# Patient Record
Sex: Female | Born: 1985 | Race: White | Hispanic: No | Marital: Married | Smoking: Never smoker
Health system: Southern US, Community
[De-identification: ages and names within clinical notes are randomized; demographics above are authoritative.]

## PROBLEM LIST (undated history)

## (undated) DIAGNOSIS — R519 Headache, unspecified: Secondary | ICD-10-CM

## (undated) DIAGNOSIS — F32A Depression, unspecified: Secondary | ICD-10-CM

## (undated) DIAGNOSIS — F419 Anxiety disorder, unspecified: Secondary | ICD-10-CM

## (undated) DIAGNOSIS — Z789 Other specified health status: Secondary | ICD-10-CM

## (undated) HISTORY — PX: MOUTH SURGERY: SHX715

## (undated) HISTORY — DX: Depression, unspecified: F32.A

## (undated) HISTORY — PX: DILATION AND CURETTAGE OF UTERUS: SHX78

## (undated) HISTORY — DX: Anxiety disorder, unspecified: F41.9

## (undated) HISTORY — PX: LITHOTRIPSY: SUR834

## (undated) HISTORY — DX: Headache, unspecified: R51.9

---
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
## 2006-12-23 ENCOUNTER — Inpatient Hospital Stay (HOSPITAL_COMMUNITY): Admission: AD | Admit: 2006-12-23 | Discharge: 2006-12-25 | Payer: Self-pay | Admitting: Obstetrics and Gynecology

## 2010-12-26 LAB — CBC
HCT: 29.5 — ABNORMAL LOW
Hemoglobin: 10.2 — ABNORMAL LOW
MCHC: 34.7
MCV: 93.2
MCV: 94.7
Platelets: 163
RBC: 3.11 — ABNORMAL LOW
RBC: 3.34 — ABNORMAL LOW
RDW: 13
WBC: 12.1 — ABNORMAL HIGH
WBC: 14.6 — ABNORMAL HIGH

## 2010-12-26 LAB — SYPHILIS: RPR W/REFLEX TO RPR TITER AND TREPONEMAL ANTIBODIES, TRADITIONAL SCREENING AND DIAGNOSIS ALGORITHM: RPR Ser Ql: NONREACTIVE

## 2011-02-25 ENCOUNTER — Inpatient Hospital Stay (HOSPITAL_COMMUNITY)
Admission: AD | Admit: 2011-02-25 | Discharge: 2011-02-26 | Disposition: A | Discharge status: Home / Self Care | Payer: PRIVATE HEALTH INSURANCE | Source: Ambulatory Visit | Attending: Obstetrics & Gynecology | Admitting: Obstetrics & Gynecology

## 2011-02-25 ENCOUNTER — Encounter (HOSPITAL_COMMUNITY): Payer: Self-pay | Admitting: *Deleted

## 2011-02-25 DIAGNOSIS — O021 Missed abortion: Secondary | ICD-10-CM

## 2011-02-25 LAB — URINALYSIS, ROUTINE W REFLEX MICROSCOPIC
Bilirubin Urine: NEGATIVE
Specific Gravity, Urine: 1.02 (ref 1.005–1.030)
pH: 6 (ref 5.0–8.0)

## 2011-02-25 LAB — URINE MICROSCOPIC-ADD ON

## 2011-02-25 NOTE — ED Provider Notes (Signed)
History     Chief Complaint  Patient presents with  . Vaginal Bleeding   HPI 25 y.o. G2P1 at [redacted]w[redacted]d with spotting starting tonight. No recent intercourse or exam. Low back pain tonight, no cramping. Prenatal care with Dr. Henderson Cloud. Pt states she had a normal IUP on u/s a few weeks. Blood type A positive.     History reviewed. No pertinent past medical history.  History reviewed. No pertinent past surgical history.  Family History  Problem Relation Age of Onset  . Heart disease Father   . Diabetes Maternal Grandmother   . Heart disease Maternal Grandmother     History  Substance Use Topics  . Smoking status: Never Smoker   . Smokeless tobacco: Not on file  . Alcohol Use: No    Allergies:  Allergies  Allergen Reactions  . Sulfa Antibiotics Other (See Comments)    unknown    Prescriptions prior to admission  Medication Sig Dispense Refill  . acetaminophen (TYLENOL) 325 MG tablet Take 650 mg by mouth every 6 (six) hours as needed. For pain       . Calcium Carbonate Antacid (TUMS PO) Take 1 tablet by mouth daily.        . prenatal vitamin w/FE, FA (PRENATAL 1 + 1) 27-1 MG TABS Take 1 tablet by mouth daily.          Review of Systems  Constitutional: Negative.   Respiratory: Negative.   Cardiovascular: Negative.   Gastrointestinal: Negative for nausea, vomiting, abdominal pain, diarrhea and constipation.  Genitourinary: Negative for dysuria, urgency, frequency, hematuria and flank pain.       Positive for vaginal bleeding  Musculoskeletal: Positive for back pain.  Neurological: Negative.   Psychiatric/Behavioral: Negative.    Physical Exam   Blood pressure 139/77, pulse 81, temperature 98.3 F (36.8 C), resp. rate 18, height 5\' 7"  (1.702 m), weight 156 lb (70.761 kg), SpO2 99.00%.  Physical Exam  Constitutional: She is oriented to person, place, and time. She appears well-developed and well-nourished. No distress.  HENT:  Head: Normocephalic and atraumatic.    Cardiovascular: Normal rate.   Respiratory: Effort normal. No respiratory distress.  GI: Soft. She exhibits no distension and no mass. There is no tenderness. There is no rebound and no guarding.  Genitourinary: There is no rash or lesion on the right labia. There is no rash or lesion on the left labia. Uterus is not deviated, not enlarged, not fixed and not tender. Cervix exhibits no motion tenderness, no discharge and no friability. Right adnexum displays no mass, no tenderness and no fullness. Left adnexum displays no mass, no tenderness and no fullness. There is bleeding (brown/red blood and mucous noted in vagina, no active bleeding from cervix) around the vagina. No erythema or tenderness around the vagina. No vaginal discharge found.       Uterus extremely retroverted Cervix long/firm/closed  Neurological: She is alert and oriented to person, place, and time.  Skin: Skin is warm and dry.  Psychiatric: She has a normal mood and affect.    MAU Course  Procedures  Unable to doppler FHT or to visualize IUP on bedside u/s  Results for orders placed during the hospital encounter of 02/25/11 (from the past 24 hour(s))  URINALYSIS, ROUTINE W REFLEX MICROSCOPIC     Status: Abnormal   Collection Time   02/25/11 10:34 PM      Component Value Range   Color, Urine YELLOW  YELLOW    APPearance CLEAR  CLEAR  Specific Gravity, Urine 1.020  1.005 - 1.030    pH 6.0  5.0 - 8.0    Glucose, UA NEGATIVE  NEGATIVE (mg/dL)   Hgb urine dipstick LARGE (*) NEGATIVE    Bilirubin Urine NEGATIVE  NEGATIVE    Ketones, ur NEGATIVE  NEGATIVE (mg/dL)   Protein, ur NEGATIVE  NEGATIVE (mg/dL)   Urobilinogen, UA 0.2  0.0 - 1.0 (mg/dL)   Nitrite NEGATIVE  NEGATIVE    Leukocytes, UA NEGATIVE  NEGATIVE   URINE MICROSCOPIC-ADD ON     Status: Abnormal   Collection Time   02/25/11 10:34 PM      Component Value Range   Squamous Epithelial / LPF RARE  RARE    WBC, UA 0-2  <3 (WBC/hpf)   RBC / HPF 21-50  <3  (RBC/hpf)   Bacteria, UA FEW (*) RARE   WET PREP, GENITAL     Status: Abnormal   Collection Time   02/25/11 11:10 PM      Component Value Range   Yeast, Wet Prep NONE SEEN  NONE SEEN    Trich, Wet Prep NONE SEEN  NONE SEEN    Clue Cells, Wet Prep NONE SEEN  NONE SEEN    WBC, Wet Prep HPF POC FEW (*) NONE SEEN    U/S: 9.2 week size IUP with no FHR. Fetal demise.   Assessment and Plan  25 y.o. G2P1 at [redacted]w[redacted]d Missed AB F/U in office tomorrow  Kathia Covington 02/26/2011, 1:23 AM

## 2011-02-25 NOTE — Progress Notes (Signed)
Pt reports blood on tissue when wiping tonight. Low back pain. G2P1

## 2011-02-26 ENCOUNTER — Inpatient Hospital Stay (HOSPITAL_COMMUNITY): Payer: PRIVATE HEALTH INSURANCE

## 2011-02-26 ENCOUNTER — Other Ambulatory Visit: Payer: Self-pay | Admitting: Obstetrics and Gynecology

## 2011-02-26 DIAGNOSIS — O021 Missed abortion: Secondary | ICD-10-CM | POA: Diagnosis present

## 2011-02-26 MED ORDER — ZOLPIDEM TARTRATE 10 MG PO TABS: 10.0000 mg | ORAL_TABLET | Freq: Once | ORAL | Status: DC

## 2011-02-27 ENCOUNTER — Ambulatory Visit (HOSPITAL_COMMUNITY): Payer: PRIVATE HEALTH INSURANCE | Admitting: Anesthesiology

## 2011-02-27 ENCOUNTER — Other Ambulatory Visit: Payer: Self-pay | Admitting: Obstetrics and Gynecology

## 2011-02-27 ENCOUNTER — Encounter (HOSPITAL_COMMUNITY): Payer: Self-pay | Admitting: Anesthesiology

## 2011-02-27 ENCOUNTER — Encounter (HOSPITAL_COMMUNITY)
Admission: RE | Disposition: A | Discharge status: Home / Self Care | Payer: Self-pay | Source: Ambulatory Visit | Attending: Obstetrics and Gynecology

## 2011-02-27 ENCOUNTER — Ambulatory Visit (HOSPITAL_COMMUNITY)
Admission: RE | Admit: 2011-02-27 | Discharge: 2011-02-27 | Disposition: A | Discharge status: Home / Self Care | Payer: PRIVATE HEALTH INSURANCE | Source: Ambulatory Visit | Attending: Obstetrics and Gynecology | Admitting: Obstetrics and Gynecology

## 2011-02-27 ENCOUNTER — Encounter (HOSPITAL_COMMUNITY): Payer: Self-pay | Admitting: *Deleted

## 2011-02-27 DIAGNOSIS — O021 Missed abortion: Secondary | ICD-10-CM

## 2011-02-27 HISTORY — PX: DILATION AND EVACUATION: SHX1459

## 2011-02-27 LAB — CBC
HCT: 37.7 % (ref 36.0–46.0)
Hemoglobin: 13.3 g/dL (ref 12.0–15.0)
MCH: 31.3 pg (ref 26.0–34.0)
MCHC: 35.3 g/dL (ref 30.0–36.0)

## 2011-02-27 LAB — GC/CHLAMYDIA PROBE AMP, GENITAL
Chlamydia, DNA Probe: NEGATIVE
GC Probe Amp, Genital: NEGATIVE

## 2011-02-27 SURGERY — DILATION AND EVACUATION, UTERUS
Anesthesia: Monitor Anesthesia Care

## 2011-02-27 MED ORDER — METHYLERGONOVINE MALEATE 0.2 MG/ML IJ SOLN
INTRAMUSCULAR | Status: DC | PRN
  Administered 2011-02-27: 0.2 mg via INTRAMUSCULAR

## 2011-02-27 MED ORDER — ONDANSETRON HCL 4 MG/2ML IJ SOLN
INTRAMUSCULAR | Status: AC
  Filled 2011-02-27: qty 2

## 2011-02-27 MED ORDER — MIDAZOLAM HCL 5 MG/5ML IJ SOLN
INTRAMUSCULAR | Status: DC | PRN
  Administered 2011-02-27: 2 mg via INTRAVENOUS

## 2011-02-27 MED ORDER — KETOROLAC TROMETHAMINE 30 MG/ML IJ SOLN
INTRAMUSCULAR | Status: AC
  Filled 2011-02-27: qty 1

## 2011-02-27 MED ORDER — PROMETHAZINE HCL 25 MG/ML IJ SOLN: 6.2500 mg | INTRAMUSCULAR | Status: DC | PRN

## 2011-02-27 MED ORDER — FENTANYL CITRATE 0.05 MG/ML IJ SOLN
INTRAMUSCULAR | Status: AC
  Filled 2011-02-27: qty 2

## 2011-02-27 MED ORDER — IBUPROFEN 200 MG PO TABS: 800.0000 mg | ORAL_TABLET | Freq: Three times a day (TID) | ORAL | Status: AC | PRN

## 2011-02-27 MED ORDER — DEXAMETHASONE SODIUM PHOSPHATE 10 MG/ML IJ SOLN
INTRAMUSCULAR | Status: AC
  Filled 2011-02-27: qty 1

## 2011-02-27 MED ORDER — LIDOCAINE HCL (CARDIAC) 20 MG/ML IV SOLN
INTRAVENOUS | Status: AC
  Filled 2011-02-27: qty 5

## 2011-02-27 MED ORDER — FENTANYL CITRATE 0.05 MG/ML IJ SOLN
INTRAMUSCULAR | Status: DC | PRN
  Administered 2011-02-27: 50 ug via INTRAVENOUS

## 2011-02-27 MED ORDER — PROPOFOL 10 MG/ML IV EMUL
INTRAVENOUS | Status: DC | PRN
  Administered 2011-02-27: 200 mg via INTRAVENOUS

## 2011-02-27 MED ORDER — ONDANSETRON HCL 4 MG/2ML IJ SOLN
INTRAMUSCULAR | Status: DC | PRN
  Administered 2011-02-27: 4 mg via INTRAVENOUS

## 2011-02-27 MED ORDER — ACETAMINOPHEN 325 MG PO TABS: 325.0000 mg | ORAL_TABLET | ORAL | Status: DC | PRN

## 2011-02-27 MED ORDER — FENTANYL CITRATE 0.05 MG/ML IJ SOLN: 25.0000 ug | INTRAMUSCULAR | Status: DC | PRN

## 2011-02-27 MED ORDER — KETOROLAC TROMETHAMINE 30 MG/ML IJ SOLN
15.0000 mg | Freq: Once | INTRAMUSCULAR | Status: AC | PRN
  Administered 2011-02-27: 30 mg via INTRAVENOUS

## 2011-02-27 MED ORDER — MIDAZOLAM HCL 2 MG/2ML IJ SOLN
INTRAMUSCULAR | Status: AC
  Filled 2011-02-27: qty 2

## 2011-02-27 MED ORDER — LACTATED RINGERS IV SOLN
INTRAVENOUS | Status: DC
  Administered 2011-02-27 (×3): via INTRAVENOUS

## 2011-02-27 MED ORDER — PROPOFOL 10 MG/ML IV EMUL
INTRAVENOUS | Status: AC
  Filled 2011-02-27: qty 20

## 2011-02-27 MED ORDER — LIDOCAINE HCL (CARDIAC) 20 MG/ML IV SOLN
INTRAVENOUS | Status: DC | PRN
  Administered 2011-02-27: 100 mg via INTRAVENOUS

## 2011-02-27 SURGICAL SUPPLY — 15 items
CATH ROBINSON RED A/P 16FR (CATHETERS) ×2 IMPLANT
CLOTH BEACON ORANGE TIMEOUT ST (SAFETY) ×2 IMPLANT
DECANTER SPIKE VIAL GLASS SM (MISCELLANEOUS) ×2 IMPLANT
GLOVE BIO SURGEON STRL SZ7 (GLOVE) ×4 IMPLANT
GOWN PREVENTION PLUS LG XLONG (DISPOSABLE) ×2 IMPLANT
KIT BERKELEY 1ST TRIMESTER 3/8 (MISCELLANEOUS) ×2 IMPLANT
NS IRRIG 1000ML POUR BTL (IV SOLUTION) ×2 IMPLANT
PACK VAGINAL MINOR WOMEN LF (CUSTOM PROCEDURE TRAY) ×2 IMPLANT
PAD PREP 24X48 CUFFED NSTRL (MISCELLANEOUS) ×2 IMPLANT
SET BERKELEY SUCTION TUBING (SUCTIONS) ×2 IMPLANT
TOWEL OR 17X24 6PK STRL BLUE (TOWEL DISPOSABLE) ×4 IMPLANT
VACURETTE 10 RIGID CVD (CANNULA) IMPLANT
VACURETTE 7MM CVD STRL WRAP (CANNULA) IMPLANT
VACURETTE 8 RIGID CVD (CANNULA) IMPLANT
VACURETTE 9 RIGID CVD (CANNULA) ×1 IMPLANT

## 2011-02-27 NOTE — Anesthesia Postprocedure Evaluation (Signed)
  Anesthesia Post-op Note  Patient: Diana Warren  Procedure(s) Performed:  DILATATION AND EVACUATION (D&E)  Patient Location: PACU  Anesthesia Type: General  Level of Consciousness: awake, alert  and oriented  Airway and Oxygen Therapy: Patient Spontanous Breathing  Post-op Pain: none  Post-op Assessment: Post-op Vital signs reviewed, Patient's Cardiovascular Status Stable, Respiratory Function Stable, Patent Airway, No signs of Nausea or vomiting and Pain level controlled  Post-op Vital Signs: Reviewed and stable  Complications: No apparent anesthesia complications

## 2011-02-27 NOTE — Brief Op Note (Signed)
02/27/2011  7:42 AM  PATIENT:  Diana Warren  25 y.o. female  PRE-OPERATIVE DIAGNOSIS:  missed ab  POST-OPERATIVE DIAGNOSIS:  missed ab  PROCEDURE:  Procedure(s): DILATATION AND EVACUATION (D&E)  SURGEON:  Surgeon(s): Nicky Milhouse A Aime Carreras  PHYSICIAN ASSISTANT:   ASSISTANTS: none   ANESTHESIA:   general  EBL:  Total I/O In: 1000 [I.V.:1000] Out: 200 [Urine:150; Blood:50]  SPECIMEN:  Source of Specimen:  uterine currettings  DISPOSITION OF SPECIMEN:  PATHOLOGY  COUNTS:  YES  DICTATION: see below  PLAN OF CARE: Discharge to home after PACU  PATIENT DISPOSITION:  PACU - hemodynamically stable.   Delay start of Pharmacological VTE agent (>24hrs) due to surgical blood loss or risk of bleeding:/NOT APPLICABLE:20182  Medications: Methergine  Complications: None  Findings:  8 week size uterus to 6 size post procedure.  Good crie was achieved.  After adequate anesthesia was achieved, the patient was prepped and draped in the usual sterile fashion.  The speculum was placed in the vagina and the cervix stabilized with a single-tooth tenaculum.  The cervix was dilated with Pratt dilators and the 9 mm curette was used to remove contents of the uterus.  Alternating sharp curettage with a curette and suction curettage was performed until all contents were removed and good crie was achieved.  All instruments were removed from the vagina.  The patient tolerated the procedure well.    [name]     

## 2011-02-27 NOTE — Op Note (Signed)
02/27/2011  7:42 AM  PATIENT:  Diana Warren  25 y.o. female  PRE-OPERATIVE DIAGNOSIS:  missed ab  POST-OPERATIVE DIAGNOSIS:  missed ab  PROCEDURE:  Procedure(s): DILATATION AND EVACUATION (D&E)  SURGEON:  Surgeon(s): Loney Laurence  PHYSICIAN ASSISTANT:   ASSISTANTS: none   ANESTHESIA:   general  EBL:  Total I/O In: 1000 [I.V.:1000] Out: 200 [Urine:150; Blood:50]  SPECIMEN:  Source of Specimen:  uterine currettings  DISPOSITION OF SPECIMEN:  PATHOLOGY  COUNTS:  YES  DICTATION: see below  PLAN OF CARE: Discharge to home after PACU  PATIENT DISPOSITION:  PACU - hemodynamically stable.   Delay start of Pharmacological VTE agent (>24hrs) due to surgical blood loss or risk of bleeding:/NOT APPLICABLE:20182  Medications: Methergine  Complications: None  Findings:  8 week size uterus to 6 size post procedure.  Good crie was achieved.  After adequate anesthesia was achieved, the patient was prepped and draped in the usual sterile fashion.  The speculum was placed in the vagina and the cervix stabilized with a single-tooth tenaculum.  The cervix was dilated with Shawnie Pons dilators and the 9 mm curette was used to remove contents of the uterus.  Alternating sharp curettage with a curette and suction curettage was performed until all contents were removed and good crie was achieved.  All instruments were removed from the vagina.  The patient tolerated the procedure well.    Caven Perine A

## 2011-02-27 NOTE — Progress Notes (Signed)
Patient given comfort packet

## 2011-02-27 NOTE — Transfer of Care (Signed)
Immediate Anesthesia Transfer of Care Note  Patient: Diana Warren  Procedure(s) Performed:  DILATATION AND EVACUATION (D&E)  Patient Location: PACU  Anesthesia Type: General  Level of Consciousness: awake, alert  and oriented  Airway & Oxygen Therapy: Patient Spontanous Breathing  Post-op Assessment: Report given to PACU RN  Post vital signs: Reviewed and stable  Complications: No apparent anesthesia complications

## 2011-02-27 NOTE — H&P (Signed)
25 y.o. yo with U/S confirmed MAB at 5 weeks.  History reviewed. No pertinent past medical history.History reviewed. No pertinent past surgical history.  History   Social History  . Marital Status: Married    Spouse Name: N/Warren    Number of Children: N/Warren  . Years of Education: N/Warren   Occupational History  . Not on file.   Social History Main Topics  . Smoking status: Never Smoker   . Smokeless tobacco: Never Used  . Alcohol Use: No  . Drug Use: No  . Sexually Active:    Other Topics Concern  . Not on file   Social History Narrative  . No narrative on file    No current facility-administered medications on file prior to encounter.   Current Outpatient Prescriptions on File Prior to Encounter  Medication Sig Dispense Refill  . acetaminophen (TYLENOL) 325 MG tablet Take 650 mg by mouth every 6 (six) hours as needed. For pain       . Calcium Carbonate Antacid (TUMS PO) Take 1 tablet by mouth daily.        . prenatal vitamin w/FE, FA (PRENATAL 1 + 1) 27-1 MG TABS Take 1 tablet by mouth daily.          Allergies  Allergen Reactions  . Sulfa Antibiotics Other (See Comments)    unknown    Filed Vitals:   02/27/11 0602  BP: 117/74  Pulse: 86  Temp: 98.2 F (36.8 C)  TempSrc: Oral  Resp: 18  SpO2: 100%    Lungs: clear to ascultation Cor:  RRR Abdomen:  soft, nontender, nondistended. Ex:  no cords, erythema Pelvic:  6 weeks size uterus, RV, cervix closed  U/S IUP embryo 5 2/7 weeks, no FHTs.  Warren:  MAB   P:  For D&E.   All risks, benefits and alternatives d/w patient and she desires to proceed.   Blood type recorded at office in prior pregnancy to be B+.  Diana Warren

## 2011-02-27 NOTE — Anesthesia Preprocedure Evaluation (Signed)
Anesthesia Evaluation  Patient identified by MRN, date of birth, ID band Patient awake    Reviewed: Allergy & Precautions, H&P , Patient's Chart, lab work & pertinent test results, reviewed documented beta blocker date and time   History of Anesthesia Complications Negative for: history of anesthetic complications  Airway Mallampati: II TM Distance: >3 FB Neck ROM: full    Dental No notable dental hx.    Pulmonary neg pulmonary ROS,  clear to auscultation  Pulmonary exam normal       Cardiovascular Exercise Tolerance: Good neg cardio ROS regular Normal    Neuro/Psych Negative Neurological ROS  Negative Psych ROS   GI/Hepatic negative GI ROS, Neg liver ROS,   Endo/Other  Negative Endocrine ROS  Renal/GU negative Renal ROS     Musculoskeletal   Abdominal   Peds  Hematology negative hematology ROS (+)   Anesthesia Other Findings   Reproductive/Obstetrics negative OB ROS                           Anesthesia Physical Anesthesia Plan  ASA: I  Anesthesia Plan: MAC   Post-op Pain Management:    Induction:   Airway Management Planned:   Additional Equipment:   Intra-op Plan:   Post-operative Plan:   Informed Consent: I have reviewed the patients History and Physical, chart, labs and discussed the procedure including the risks, benefits and alternatives for the proposed anesthesia with the patient or authorized representative who has indicated his/her understanding and acceptance.   Dental Advisory Given  Plan Discussed with: CRNA, Surgeon and Anesthesiologist  Anesthesia Plan Comments:         Anesthesia Quick Evaluation  

## 2011-03-01 NOTE — ED Provider Notes (Signed)
This patient was seen, diagnosed, and treated by the EDP.  I was called and notified of her findings and approved her disposition.  Hospital policy requires me to sign off on this encounter.

## 2011-03-03 ENCOUNTER — Encounter (HOSPITAL_COMMUNITY): Payer: Self-pay | Admitting: Obstetrics and Gynecology

## 2011-04-18 ENCOUNTER — Inpatient Hospital Stay (HOSPITAL_COMMUNITY): Payer: PRIVATE HEALTH INSURANCE

## 2011-04-18 ENCOUNTER — Encounter (HOSPITAL_COMMUNITY): Payer: Self-pay

## 2011-04-18 ENCOUNTER — Inpatient Hospital Stay (HOSPITAL_COMMUNITY)
Admission: AD | Admit: 2011-04-18 | Discharge: 2011-04-18 | Disposition: A | Discharge status: Home / Self Care | Payer: PRIVATE HEALTH INSURANCE | Source: Ambulatory Visit | Attending: Obstetrics and Gynecology | Admitting: Obstetrics and Gynecology

## 2011-04-18 DIAGNOSIS — O039 Complete or unspecified spontaneous abortion without complication: Secondary | ICD-10-CM | POA: Insufficient documentation

## 2011-04-18 NOTE — Progress Notes (Signed)
Patient is here with c/o sudden onset of bright red vaginal bleeding that started at 1800pm after her dr's appt today. She states that she had an ultrasound at the office and it confirmed iup. She states that she she is having dull lower constant back pain. The pad that she placed in triage is small amount of blood. She states that had a miscarriage in December 2012 and has not had a period since then.
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
## 2012-02-20 LAB — OB RESULTS CONSOLE GC/CHLAMYDIA: Chlamydia: NEGATIVE

## 2012-02-20 LAB — OB RESULTS CONSOLE RUBELLA ANTIBODY, IGM: Rubella: IMMUNE

## 2012-02-20 LAB — OB RESULTS CONSOLE HEPATITIS B SURFACE ANTIGEN: Hepatitis B Surface Ag: NEGATIVE

## 2012-02-20 LAB — OB RESULTS CONSOLE RPR: RPR: NONREACTIVE

## 2012-03-17 NOTE — L&D Delivery Note (Signed)
Delivery Note At 12:29 PM a viable and healthy female was delivered via Vaginal, Spontaneous Delivery (Presentation: Left Occiput Anterior).  APGAR: 8, 9; weight 8#5.   Placenta status: Intact, Spontaneous.  Cord: 3 vessels with a tight double nuchal cord   Pt delivered the infants head to crowning.  Following the delivery of the head the anterior shoulder did not easily deliver.  The patient was placed in [place] and Suprapubic pressure was applied.  The anterior shoulder then delivered and a double nuchal cord was noted but was not reduced before the posterior shoulder and body were delivered. The nuchal was reduced and the infant was passed to the waiting maternal abdomen.  The shoulder dystocia lasted for no more than 30 seconds.  The Cord was clamped and cut and the placenta was then delivered.  A right labial laceration was repaired with 4-0 vicryl and a small 2nd degree laceration was repaired with 3-0 vicryl.  Mom and baby are doing well after delivery.  Anesthesia: Epidural  Episiotomy: None Lacerations: 2nd degree;Perineal & right labial Suture Repair: 3.0 vicryl 4.0 vicryl Est. Blood Loss (mL): 250  Mom to postpartum.  Baby to nursery-stable.  Zaveon Gillen H. 08/21/2012, 12:56 PM

## 2012-08-07 LAB — OB RESULTS CONSOLE ABO/RH

## 2012-08-21 ENCOUNTER — Inpatient Hospital Stay (HOSPITAL_COMMUNITY): Payer: PRIVATE HEALTH INSURANCE | Admitting: Anesthesiology

## 2012-08-21 ENCOUNTER — Inpatient Hospital Stay (HOSPITAL_COMMUNITY)
Admission: AD | Admit: 2012-08-21 | Discharge: 2012-08-22 | DRG: 775 | Disposition: A | Discharge status: Home / Self Care | Payer: PRIVATE HEALTH INSURANCE | Source: Ambulatory Visit | Attending: Obstetrics and Gynecology | Admitting: Obstetrics and Gynecology

## 2012-08-21 ENCOUNTER — Encounter (HOSPITAL_COMMUNITY): Payer: Self-pay | Admitting: *Deleted

## 2012-08-21 ENCOUNTER — Encounter (HOSPITAL_COMMUNITY): Payer: Self-pay | Admitting: Anesthesiology

## 2012-08-21 HISTORY — DX: Other specified health status: Z78.9

## 2012-08-21 LAB — CBC
HCT: 32.7 % — ABNORMAL LOW (ref 36.0–46.0)
Hemoglobin: 11.4 g/dL — ABNORMAL LOW (ref 12.0–15.0)
MCV: 88.4 fL (ref 78.0–100.0)
RBC: 3.7 MIL/uL — ABNORMAL LOW (ref 3.87–5.11)
RDW: 12.8 % (ref 11.5–15.5)
WBC: 11.2 10*3/uL — ABNORMAL HIGH (ref 4.0–10.5)

## 2012-08-21 LAB — RPR: RPR Ser Ql: NONREACTIVE

## 2012-08-21 MED ORDER — ONDANSETRON HCL 4 MG PO TABS: 4.0000 mg | ORAL_TABLET | ORAL | Status: DC | PRN

## 2012-08-21 MED ORDER — IBUPROFEN 600 MG PO TABS: 600.0000 mg | ORAL_TABLET | Freq: Four times a day (QID) | ORAL | Status: DC | PRN

## 2012-08-21 MED ORDER — PHENYLEPHRINE 40 MCG/ML (10ML) SYRINGE FOR IV PUSH (FOR BLOOD PRESSURE SUPPORT)
80.0000 ug | PREFILLED_SYRINGE | INTRAVENOUS | Status: DC | PRN
  Filled 2012-08-21: qty 2

## 2012-08-21 MED ORDER — LIDOCAINE HCL (PF) 1 % IJ SOLN
30.0000 mL | INTRAMUSCULAR | Status: DC | PRN
  Filled 2012-08-21 (×2): qty 30

## 2012-08-21 MED ORDER — CITRIC ACID-SODIUM CITRATE 334-500 MG/5ML PO SOLN: 30.0000 mL | ORAL | Status: DC | PRN

## 2012-08-21 MED ORDER — FLEET ENEMA 7-19 GM/118ML RE ENEM: 1.0000 | ENEMA | RECTAL | Status: DC | PRN

## 2012-08-21 MED ORDER — DIPHENHYDRAMINE HCL 50 MG/ML IJ SOLN: 12.5000 mg | INTRAMUSCULAR | Status: DC | PRN

## 2012-08-21 MED ORDER — LIDOCAINE HCL (PF) 1 % IJ SOLN
INTRAMUSCULAR | Status: DC | PRN
  Administered 2012-08-21 (×2): 9 mL

## 2012-08-21 MED ORDER — METHYLERGONOVINE MALEATE 0.2 MG PO TABS: 0.2000 mg | ORAL_TABLET | ORAL | Status: DC | PRN

## 2012-08-21 MED ORDER — BENZOCAINE-MENTHOL 20-0.5 % EX AERO
1.0000 "application " | INHALATION_SPRAY | CUTANEOUS | Status: DC | PRN
  Administered 2012-08-21: 1 via TOPICAL
  Filled 2012-08-21: qty 56

## 2012-08-21 MED ORDER — EPHEDRINE 5 MG/ML INJ
10.0000 mg | INTRAVENOUS | Status: DC | PRN
  Filled 2012-08-21: qty 2

## 2012-08-21 MED ORDER — BUTORPHANOL TARTRATE 1 MG/ML IJ SOLN
1.0000 mg | INTRAMUSCULAR | Status: DC | PRN
  Administered 2012-08-21 (×2): 1 mg via INTRAVENOUS
  Filled 2012-08-21 (×2): qty 1

## 2012-08-21 MED ORDER — DIBUCAINE 1 % RE OINT: 1.0000 "application " | TOPICAL_OINTMENT | RECTAL | Status: DC | PRN

## 2012-08-21 MED ORDER — LACTATED RINGERS IV SOLN
INTRAVENOUS | Status: DC
  Administered 2012-08-21 (×2): via INTRAVENOUS

## 2012-08-21 MED ORDER — PHENYLEPHRINE 40 MCG/ML (10ML) SYRINGE FOR IV PUSH (FOR BLOOD PRESSURE SUPPORT)
80.0000 ug | PREFILLED_SYRINGE | INTRAVENOUS | Status: DC | PRN
  Filled 2012-08-21: qty 2
  Filled 2012-08-21: qty 5

## 2012-08-21 MED ORDER — DIPHENHYDRAMINE HCL 25 MG PO CAPS: 25.0000 mg | ORAL_CAPSULE | Freq: Four times a day (QID) | ORAL | Status: DC | PRN

## 2012-08-21 MED ORDER — EPHEDRINE 5 MG/ML INJ
10.0000 mg | INTRAVENOUS | Status: DC | PRN
  Filled 2012-08-21: qty 4
  Filled 2012-08-21: qty 2

## 2012-08-21 MED ORDER — WITCH HAZEL-GLYCERIN EX PADS: 1.0000 "application " | MEDICATED_PAD | CUTANEOUS | Status: DC | PRN

## 2012-08-21 MED ORDER — TETANUS-DIPHTH-ACELL PERTUSSIS 5-2.5-18.5 LF-MCG/0.5 IM SUSP: 0.5000 mL | Freq: Once | INTRAMUSCULAR | Status: DC

## 2012-08-21 MED ORDER — ONDANSETRON HCL 4 MG/2ML IJ SOLN: 4.0000 mg | INTRAMUSCULAR | Status: DC | PRN

## 2012-08-21 MED ORDER — IBUPROFEN 600 MG PO TABS
600.0000 mg | ORAL_TABLET | Freq: Four times a day (QID) | ORAL | Status: DC
  Administered 2012-08-21 – 2012-08-22 (×4): 600 mg via ORAL
  Filled 2012-08-21 (×4): qty 1

## 2012-08-21 MED ORDER — OXYCODONE-ACETAMINOPHEN 5-325 MG PO TABS: 1.0000 | ORAL_TABLET | ORAL | Status: DC | PRN

## 2012-08-21 MED ORDER — LANOLIN HYDROUS EX OINT: TOPICAL_OINTMENT | CUTANEOUS | Status: DC | PRN

## 2012-08-21 MED ORDER — PRENATAL MULTIVITAMIN CH
1.0000 | ORAL_TABLET | Freq: Every day | ORAL | Status: DC
  Administered 2012-08-21 – 2012-08-22 (×2): 1 via ORAL
  Filled 2012-08-21 (×2): qty 1

## 2012-08-21 MED ORDER — ACETAMINOPHEN 325 MG PO TABS: 650.0000 mg | ORAL_TABLET | ORAL | Status: DC | PRN

## 2012-08-21 MED ORDER — LACTATED RINGERS IV SOLN: 500.0000 mL | INTRAVENOUS | Status: DC | PRN

## 2012-08-21 MED ORDER — OXYTOCIN 40 UNITS IN LACTATED RINGERS INFUSION - SIMPLE MED
62.5000 mL/h | INTRAVENOUS | Status: DC
  Filled 2012-08-21: qty 1000

## 2012-08-21 MED ORDER — METHYLERGONOVINE MALEATE 0.2 MG/ML IJ SOLN: 0.2000 mg | INTRAMUSCULAR | Status: DC | PRN

## 2012-08-21 MED ORDER — FENTANYL 2.5 MCG/ML BUPIVACAINE 1/10 % EPIDURAL INFUSION (WH - ANES)
14.0000 mL/h | INTRAMUSCULAR | Status: DC | PRN
  Filled 2012-08-21: qty 125

## 2012-08-21 MED ORDER — SENNOSIDES-DOCUSATE SODIUM 8.6-50 MG PO TABS
2.0000 | ORAL_TABLET | Freq: Every day | ORAL | Status: DC
  Administered 2012-08-22: 2 via ORAL

## 2012-08-21 MED ORDER — ONDANSETRON HCL 4 MG/2ML IJ SOLN: 4.0000 mg | Freq: Four times a day (QID) | INTRAMUSCULAR | Status: DC | PRN

## 2012-08-21 MED ORDER — SIMETHICONE 80 MG PO CHEW: 80.0000 mg | CHEWABLE_TABLET | ORAL | Status: DC | PRN

## 2012-08-21 MED ORDER — LACTATED RINGERS IV SOLN
500.0000 mL | Freq: Once | INTRAVENOUS | Status: AC
  Administered 2012-08-21: 500 mL via INTRAVENOUS

## 2012-08-21 MED ORDER — OXYTOCIN BOLUS FROM INFUSION: 500.0000 mL | INTRAVENOUS | Status: DC

## 2012-08-21 MED ORDER — ZOLPIDEM TARTRATE 5 MG PO TABS: 5.0000 mg | ORAL_TABLET | Freq: Every evening | ORAL | Status: DC | PRN

## 2012-08-21 MED ORDER — FENTANYL 2.5 MCG/ML BUPIVACAINE 1/10 % EPIDURAL INFUSION (WH - ANES)
INTRAMUSCULAR | Status: DC | PRN
  Administered 2012-08-21: 14 mL/h via EPIDURAL

## 2012-08-21 NOTE — Progress Notes (Signed)
Provider notified of pt's admission and status. Aware of ctx pattern, sve, fhr and FM strip. Will admit to [facility]

## 2012-08-21 NOTE — Anesthesia Preprocedure Evaluation (Signed)
Anesthesia Evaluation  Patient identified by MRN, date of birth, ID band Patient awake    Reviewed: Allergy & Precautions, H&P , Patient's Chart, lab work & pertinent test results  Airway Mallampati: I TM Distance: >3 FB Neck ROM: full    Dental no notable dental hx.    Pulmonary neg pulmonary ROS,    Pulmonary exam normal       Cardiovascular negative cardio ROS      Neuro/Psych negative neurological ROS  negative psych ROS   GI/Hepatic negative GI ROS, Neg liver ROS,   Endo/Other  negative endocrine ROS  Renal/GU negative Renal ROS  negative genitourinary   Musculoskeletal negative musculoskeletal ROS (+)   Abdominal Normal abdominal exam  (+)   Peds negative pediatric ROS (+)  Hematology negative hematology ROS (+)   Anesthesia Other Findings   Reproductive/Obstetrics (+) Pregnancy                           Anesthesia Physical Anesthesia Plan  ASA: II  Anesthesia Plan: Epidural   Post-op Pain Management:    Induction:   Airway Management Planned:   Additional Equipment:   Intra-op Plan:   Post-operative Plan:   Informed Consent: I have reviewed the patients History and Physical, chart, labs and discussed the procedure including the risks, benefits and alternatives for the proposed anesthesia with the patient or authorized representative who has indicated his/her understanding and acceptance.     Plan Discussed with:   Anesthesia Plan Comments:         Anesthesia Quick Evaluation

## 2012-08-21 NOTE — Anesthesia Procedure Notes (Signed)
Epidural Patient location during procedure: OB Start time: 08/21/2012 8:21 AM End time: 08/21/2012 8:25 AM  Staffing Anesthesiologist: Sandrea Hughs Performed by: anesthesiologist   Preanesthetic Checklist Completed: patient identified, site marked, surgical consent, pre-op evaluation, timeout performed, IV checked, risks and benefits discussed and monitors and equipment checked  Epidural Patient position: sitting Prep: site prepped and draped and DuraPrep Patient monitoring: continuous pulse ox and blood pressure Approach: midline Injection technique: LOR air  Needle:  Needle type: Tuohy  Needle gauge: 17 G Needle length: 9 cm and 9 Needle insertion depth: 5 cm cm Catheter type: closed end flexible Catheter size: 19 Gauge Catheter at skin depth: 10 cm Test dose: negative and Other  Assessment Sensory level: T9 Events: blood not aspirated, injection not painful, no injection resistance, negative IV test and no paresthesia  Additional Notes Reason for block:procedure for pain

## 2012-08-21 NOTE — Progress Notes (Signed)
Report called to [facility] in [facility]. Pt ambulated per request to 170 with NT and pt's family.

## 2012-08-21 NOTE — MAU Note (Signed)
Pt up to BR at 12mn and couldn't go back to sleep due to contractions. Some bloody show

## 2012-08-22 LAB — CBC
HCT: 31 % — ABNORMAL LOW (ref 36.0–46.0)
MCV: 89.6 fL (ref 78.0–100.0)
RBC: 3.46 MIL/uL — ABNORMAL LOW (ref 3.87–5.11)
WBC: 11.6 10*3/uL — ABNORMAL HIGH (ref 4.0–10.5)

## 2012-08-22 MED ORDER — DOCUSATE SODIUM 100 MG PO CAPS: 100.0000 mg | ORAL_CAPSULE | Freq: Two times a day (BID) | ORAL | Status: DC

## 2012-08-22 MED ORDER — OXYCODONE-ACETAMINOPHEN 5-325 MG PO TABS: 2.0000 | ORAL_TABLET | ORAL | Status: DC | PRN

## 2012-08-22 MED ORDER — IBUPROFEN 600 MG PO TABS: 600.0000 mg | ORAL_TABLET | Freq: Four times a day (QID) | ORAL | Status: DC | PRN

## 2012-08-22 NOTE — Discharge Summary (Signed)
Obstetric Discharge Summary Reason for Admission: onset of labor Prenatal Procedures: ultrasound Intrapartum Procedures: spontaneous vaginal delivery Postpartum Procedures: none Complications-Operative and Postpartum: 2nd degree perineal laceration Hemoglobin  Date Value Range Status  08/22/2012 10.6* 12.0 - 15.0 g/dL Final     HCT  Date Value Range Status  08/22/2012 31.0* 36.0 - 46.0 % Final    Physical Exam:  General: alert, cooperative and appears stated age 27: appropriate Uterine Fundus: firm Incision: healing well DVT Evaluation: No evidence of DVT seen on physical exam.  Discharge Diagnoses: Term Pregnancy-delivered  Discharge Information: Date: 08/22/2012 Activity: pelvic rest Diet: routine Medications: Ibuprofen, Colace and Percocet Condition: improved Instructions: refer to practice specific booklet Discharge to: home Follow-up Information   Follow up with Almon Hercules., MD In 4 weeks. (For a postpartum evaluation)    Contact information:   [address] [place] [address] [phone]       Newborn Data: Live born female  Birth Weight: 8 lb 5 oz (3771 g) APGAR: 8, 9  Home with mother.  Diana Aki H. 08/22/2012, 11:46 AM

## 2012-08-22 NOTE — Anesthesia Postprocedure Evaluation (Signed)
Anesthesia Post Note  Patient: Diana Warren  Procedure(s) Performed: * No procedures listed *  Anesthesia type: Epidural  Patient location: Mother/Baby  Post pain: Pain level controlled  Post assessment: Post-op Vital signs reviewed  Last Vitals:  Filed Vitals:   08/22/12 0713  BP: 111/71  Pulse: 84  Temp: 36.7 C  Resp: 18    Post vital signs: Reviewed  Level of consciousness:alert  Complications: No apparent anesthesia complications

## 2012-08-22 NOTE — Lactation Note (Signed)
This note was copied from the chart of Diana Harvest Deist. Lactation Consultation Note  Patient Name: Diana Warren [id number] Date: 08/22/2012 Reason for consult: Initial assessment   Maternal Data Formula Feeding for Exclusion: No Infant to breast within first hour of birth: Yes Does the patient have breastfeeding experience prior to this delivery?: Yes  Feeding Feeding Type: Breast Milk Feeding method: Breast  LATCH Score/Interventions Latch: Grasps breast easily, tongue down, lips flanged, rhythmical sucking.  Audible Swallowing: A few with stimulation  Type of Nipple: Everted at rest and after stimulation  Comfort (Breast/Nipple): Filling, red/small blisters or bruises, mild/mod discomfort  Problem noted: Mild/Moderate discomfort Interventions (Mild/moderate discomfort): Comfort gels  Hold (Positioning): Assistance needed to correctly position infant at breast and maintain latch. Intervention(s): Breastfeeding basics reviewed;Support Pillows  LATCH Score: 7  Lactation Tools Discussed/Used     Consult Status Consult Status: Complete  Initial visit with mom. She reports that her first baby did not latch well. That she pumped for about 1 week then gave up. Reports that this baby is nursing so much better but her nipples are sore- slightly pink but intact. Assisted mom with latch and mom reports that feels much better. Reviewed wide open mouth and having the baby deep onto the breast. Mom with bruising noted on areola. BF brochure given with resources for support after DC.Reviewed BFSG and OP appointments for assist. No questions at present. Asking about pump rental- encouraged mom to call insurance company about pump before renting one. To call prn 08/22/2012, 11:09 AM

## 2012-08-23 NOTE — H&P (Signed)
Maylin Freeburg is a 27 y.o. female presenting for labor History OB History   Grav Para Term Preterm Abortions TAB SAB Ect Mult Living   5 2 2  2  2   2      Past Medical History  Diagnosis Date  . Medical history non-contributory    Past Surgical History  Procedure Laterality Date  . Dilation and evacuation  02/27/2011    Procedure: DILATATION AND EVACUATION;  Surgeon: Loney Laurence;  Location: [facility];  Service: Gynecology;  Laterality: N/A;  . Dilation and curettage of uterus     Family History: family history includes Diabetes in her maternal grandmother and Heart disease in her father and maternal grandmother. Social History:  reports that she has never smoked. She has never used smokeless tobacco. She reports that she does not drink alcohol or use illicit drugs.   Prenatal Transfer Tool  Maternal Diabetes: No Genetic Screening: Normal Maternal Ultrasounds/Referrals: Normal Fetal Ultrasounds or other Referrals:  None Maternal Substance Abuse:  None Significant Maternal Medications:  None Significant Maternal Lab Results:  None Other Comments:  NOne  ROS  Dilation: 10 Effacement (%): 100 Station: +1 Exam by:: McGrail RN Blood pressure 111/71, pulse 84, temperature 98 F (36.7 C), temperature source Oral, resp. rate 18, height 5\' 7"  (1.702 m), weight 85.276 kg (188 lb), SpO2 99.00%, unknown if currently breastfeeding. Exam Physical Exam  Prenatal labs: ABO, Rh: B/--/-- (05/24 0000) Antibody: Negative (05/24 0000) Rubella: Immune (12/06 0000) RPR: NON REACTIVE (06/07 0335)  HBsAg: Negative (12/06 0000)  HIV: Non-reactive (12/06 0000)  GBS: Negative (05/20 0000)   Assessment/Plan: 1) Admit 2) Epidural on request 3) Anticipate svd  Shterna Laramee H. 08/23/2012, 2:30 PM

## 2012-08-31 ENCOUNTER — Ambulatory Visit (HOSPITAL_COMMUNITY)
Admission: RE | Admit: 2012-08-31 | Discharge: 2012-08-31 | Disposition: A | Discharge status: Home / Self Care | Payer: PRIVATE HEALTH INSURANCE | Source: Ambulatory Visit | Attending: Obstetrics and Gynecology | Admitting: Obstetrics and Gynecology

## 2012-08-31 NOTE — Lactation Note (Signed)
Adult Lactation Consultation Outpatient Visit Note                  Patient Name: Diana Warren                   " Mykah" Date of Birth: 09/15/1985                                BW: 8-5 Gestational Age at Delivery: [redacted]w[redacted]d              todays weight:8-10.6, 3930 Type of Delivery: vaginal del                           Breastfeeding History: Frequency of Breastfeeding: every 1.5-2 hours Length of Feeding: 15 on one breast Voids: 7 Stools: 5 yellow seedy  Supplementing / Method: Pumping:  Type of Pump:Even Flow    Frequency:none  Volume:    Comments: Mother has painful (L) nipple. She describes painful latch of a #10 and a 5 through out the feeding. Mother has a long crack on outer side of (L) nipple. Nipple tissue is slightly pink. Mothers breast are full and firm. Mother thinks infant is feeding well but wants reassurance.   Consultation Evaluation: Mother fed infant for 10 mins one hour ago. Observed mother independently latching infant on (R) breast in football hold. Infant sustained latch for 15 mins. Infant observed with good burst of rhythmic suckling and swallowing. Encouraged mother to use breast compression while feeding.   Initial Feeding Assessment: Pre-feed Weight:3930 Post-feed ZOXWRU:0454 Amount Transferred:50ml Comments:  Additional Feeding Assessment:assist mother in latching infant in cross cradle hold on (L) breast. Mother has been using football. Infant latched deep and mother denied painful latch. Infant sustained latch for 8-9 mins. Pre-feed [id number]:1478 Post-feed Weight:3980 Amount Transferred:60ml Comments:  Additional Feeding Assessment: Pre-feed Weight: Post-feed Weight: Amount Transferred: Comments:  Total Breast milk Transferred this Visit: 50ml Total Supplement Given:   Additional Interventions: Recommend that mother phone her OB and get a RX for all purpose nipple ointment and use as directed. Tips given to mother on  Using cross cradle  hold until nipple heals.  Instruct mother in adjusting infants lower jaw for wider gape.  Encouraged mother to nap if possible and drinks lot of water.   Follow-Up  June 24 at 1:00    Stevan Born [facility] 08/31/2012, 1:03 PM

## 2012-09-07 ENCOUNTER — Ambulatory Visit (HOSPITAL_COMMUNITY)
Admission: RE | Admit: 2012-09-07 | Discharge: 2012-09-07 | Disposition: A | Discharge status: Home / Self Care | Payer: PRIVATE HEALTH INSURANCE | Source: Ambulatory Visit | Attending: Obstetrics and Gynecology | Admitting: Obstetrics and Gynecology

## 2012-09-07 NOTE — Lactation Note (Signed)
Adult Lactation Consultation Outpatient Visit Note  Patient Name: Diana Warren            "Mykah" Date of Birth: Feb 17, 1986                         todays weight: 9-2.9, 4166 Gestational Age at Delivery: Unknown   Gain 8 ounces since last week Type of Delivery:   Breastfeeding History: Frequency of Breastfeeding: every1.5-2 Length of Feeding: 10-15 mins Voids: 10-12 Stools: 10-12 yellow seedy  Supplementing / Method: Pumping:  Type of Pump:Medela    Frequency:none  Volume:    Comments: Mother return for follow up visit for feeding assessment. Mother very encouraged and thinks Mykah is doing very well. Mothers breast are full and firm. Mother is exclusively breastfeeding. She is not pumping.  Consultation Evaluation: Observed mother self latching infant in cross cradle hold. Infant has good deep latch with good rhythm. Lots of audible swallows.   Initial Feeding Assessment: Pre-feed AOZHYQ:6578 Post-feed IONGEX:5284 Amount Transferred:70 ml Comments:  Additional Feeding Assessment: Pre-feed Weight: Post-feed Weight: Amount Transferred: Comments:  Additional Feeding Assessment: Pre-feed Weight: Post-feed Weight: Amount Transferred: Comments:  Total Breast milk Transferred this Visit: 70 ml Total Supplement Given:   Additional Interventions: Mother was given lots of praise and admiration for doing such a great job breastfeeding exclusively. Mother recommend follow up at breastfeeding support group as needed.  Follow-Up  PRN    Stevan Born [facility] 09/07/2012, 1:12 PM

## 2014-01-16 ENCOUNTER — Encounter (HOSPITAL_COMMUNITY): Payer: Self-pay | Admitting: *Deleted

## 2016-12-11 ENCOUNTER — Emergency Department (HOSPITAL_COMMUNITY): Payer: Commercial Managed Care - PPO

## 2016-12-11 ENCOUNTER — Encounter (HOSPITAL_COMMUNITY): Payer: Self-pay | Admitting: Family Medicine

## 2016-12-11 ENCOUNTER — Emergency Department (HOSPITAL_COMMUNITY)
Admission: EM | Admit: 2016-12-11 | Discharge: 2016-12-11 | Disposition: A | Discharge status: Home / Self Care | Payer: Commercial Managed Care - PPO | Attending: Emergency Medicine | Admitting: Emergency Medicine

## 2016-12-11 DIAGNOSIS — Z79899 Other long term (current) drug therapy: Secondary | ICD-10-CM | POA: Diagnosis not present

## 2016-12-11 DIAGNOSIS — N23 Unspecified renal colic: Secondary | ICD-10-CM

## 2016-12-11 DIAGNOSIS — R109 Unspecified abdominal pain: Secondary | ICD-10-CM | POA: Diagnosis present

## 2016-12-11 DIAGNOSIS — N201 Calculus of ureter: Secondary | ICD-10-CM | POA: Diagnosis not present

## 2016-12-11 LAB — URINALYSIS, ROUTINE W REFLEX MICROSCOPIC
BILIRUBIN URINE: NEGATIVE
Glucose, UA: NEGATIVE mg/dL
KETONES UR: NEGATIVE mg/dL
Leukocytes, UA: NEGATIVE
NITRITE: POSITIVE — AB
Protein, ur: 30 mg/dL — AB
SPECIFIC GRAVITY, URINE: 1.017 (ref 1.005–1.030)
pH: 5 (ref 5.0–8.0)

## 2016-12-11 LAB — CBC WITH DIFFERENTIAL/PLATELET
BASOS PCT: 0 %
Basophils Absolute: 0 10*3/uL (ref 0.0–0.1)
EOS ABS: 0 10*3/uL (ref 0.0–0.7)
Eosinophils Relative: 0 %
HCT: 33.4 % — ABNORMAL LOW (ref 36.0–46.0)
HEMOGLOBIN: 11.8 g/dL — AB (ref 12.0–15.0)
Lymphocytes Relative: 8 %
Lymphs Abs: 0.9 10*3/uL (ref 0.7–4.0)
MCH: 30 pg (ref 26.0–34.0)
MCHC: 35.3 g/dL (ref 30.0–36.0)
MCV: 85 fL (ref 78.0–100.0)
MONOS PCT: 3 %
Monocytes Absolute: 0.4 10*3/uL (ref 0.1–1.0)
NEUTROS PCT: 89 %
Neutro Abs: 10.6 10*3/uL — ABNORMAL HIGH (ref 1.7–7.7)
PLATELETS: 229 10*3/uL (ref 150–400)
RBC: 3.93 MIL/uL (ref 3.87–5.11)
RDW: 11.7 % (ref 11.5–15.5)
WBC: 11.9 10*3/uL — AB (ref 4.0–10.5)

## 2016-12-11 LAB — LIPASE, BLOOD: Lipase: 18 U/L (ref 11–51)

## 2016-12-11 LAB — COMPREHENSIVE METABOLIC PANEL
ALBUMIN: 4.3 g/dL (ref 3.5–5.0)
ALK PHOS: 96 U/L (ref 38–126)
ALT: 15 U/L (ref 14–54)
ANION GAP: 10 (ref 5–15)
AST: 18 U/L (ref 15–41)
BUN: 10 mg/dL (ref 6–20)
CALCIUM: 8.9 mg/dL (ref 8.9–10.3)
CHLORIDE: 103 mmol/L (ref 101–111)
CO2: 24 mmol/L (ref 22–32)
Creatinine, Ser: 0.85 mg/dL (ref 0.44–1.00)
GFR calc Af Amer: >60 mL/min (ref >60)
GFR calc non Af Amer: >60 mL/min (ref >60)
GLUCOSE: 131 mg/dL — AB (ref 65–99)
POTASSIUM: 3.7 mmol/L (ref 3.5–5.1)
SODIUM: 137 mmol/L (ref 135–145)
Total Bilirubin: 0.4 mg/dL (ref 0.3–1.2)
Total Protein: 7.5 g/dL (ref 6.5–8.1)

## 2016-12-11 LAB — I-STAT BETA HCG BLOOD, ED (MC, WL, AP ONLY): I-stat hCG, quantitative: <5 m[IU]/mL (ref <5)

## 2016-12-11 MED ORDER — TAMSULOSIN HCL 0.4 MG PO CAPS: 0.4000 mg | ORAL_CAPSULE | Freq: Every day | ORAL | Status: DC

## 2016-12-11 MED ORDER — MORPHINE SULFATE (PF) 4 MG/ML IV SOLN
4.0000 mg | Freq: Once | INTRAVENOUS | Status: AC
  Administered 2016-12-11: 4 mg via INTRAVENOUS
  Filled 2016-12-11: qty 1

## 2016-12-11 MED ORDER — SODIUM CHLORIDE 0.9 % IV BOLUS (SEPSIS)
1000.0000 mL | Freq: Once | INTRAVENOUS | Status: AC
  Administered 2016-12-11: 1000 mL via INTRAVENOUS

## 2016-12-11 MED ORDER — ONDANSETRON HCL 4 MG PO TABS: 4.0000 mg | ORAL_TABLET | Freq: Four times a day (QID) | ORAL | 0 refills | Status: DC | PRN

## 2016-12-11 MED ORDER — ONDANSETRON HCL 4 MG/2ML IJ SOLN
4.0000 mg | Freq: Once | INTRAMUSCULAR | Status: AC
  Administered 2016-12-11: 4 mg via INTRAVENOUS
  Filled 2016-12-11: qty 2

## 2016-12-11 MED ORDER — IBUPROFEN 200 MG PO TABS
400.0000 mg | ORAL_TABLET | Freq: Once | ORAL | Status: AC | PRN
  Administered 2016-12-11: 400 mg via ORAL
  Filled 2016-12-11: qty 2

## 2016-12-11 MED ORDER — OXYCODONE-ACETAMINOPHEN 5-325 MG PO TABS: 1.0000 | ORAL_TABLET | ORAL | 0 refills | Status: DC | PRN

## 2016-12-11 NOTE — Discharge Instructions (Signed)
Drink plenty of fluids. Take ibuprofen or naproxen for less severe pain. Return to the ED if pain is not being adequately controlled, or if you start running a fever.

## 2016-12-11 NOTE — ED Triage Notes (Addendum)
Patient is complaining of right flank pain that started around 22:00 last night. Also, patient reports she feels like she can not empty her bladder, increased in urgency and frequency. Denies any blood in urine or fever. Patient reports she has took TYLENOL  PO with no relief.

## 2016-12-11 NOTE — ED Provider Notes (Signed)
[facility]-EMERGENCY DEPT Provider Note   CSN: [id number] Arrival date & time: 12/11/16  0203     History   Chief Complaint Chief Complaint  Patient presents with  . Flank Pain    HPI Diana Warren is a 31 y.o. female.  The history is provided by the patient.  she complains of right flank pain for the last 2 days. ML 1 week ago, she had dysuria and tenesmus which she thought was from a urinary tract infection. She took a course of ciprofloxacin with relief. Symptoms recurred about 2 days ago, and this time are associated with right flank pain. Last night, pain became severe. She rates it at 10/10. She is unable to find a position that is comfortable. Nothing makes the pain better, nothing makes it worse. There has been associated nausea and vomiting. She denies fever or chills. She states that it hurts more than when she had her children. He took acetaminophen at home without relief. She was given ibuprofen here with slight relief.  Past Medical History:  Diagnosis Date  . Medical history non-contributory     Patient Active Problem List   Diagnosis Date Noted  . Missed ab 02/26/2011    Past Surgical History:  Procedure Laterality Date  . DILATION AND CURETTAGE OF UTERUS    . DILATION AND EVACUATION  02/27/2011   Procedure: DILATATION AND EVACUATION;  Surgeon: Loney Laurence;  Location: [facility];  Service: Gynecology;  Laterality: N/A;  . MOUTH SURGERY      OB History    Gravida Para Term Preterm AB Living   SAB TAB Ectopic Multiple Live Births   2       1       Home Medications    Prior to Admission medications   Medication Sig Start Date End Date Taking? Authorizing Provider  acetaminophen (TYLENOL) 500 MG tablet Take 1,000 mg by mouth every 6 (six) hours as needed for headache.    [provider]  busPIRone (BUSPAR) 5 MG tablet Take 5 mg by mouth 3 (three) times daily. 10/30/16   [provider]  fluticasone (FLONASE) 50 MCG/ACT  nasal spray Place 2 sprays into the nose daily.    [provider]  levonorgestrel (MIRENA) 20 MCG/24HR IUD 1 each by Intrauterine route once.    [provider]  loratadine (CLARITIN) 10 MG tablet Take 10 mg by mouth daily.    [provider]  montelukast (SINGULAIR) 10 MG tablet Take 10 mg by mouth at bedtime. 10/30/16   [provider]  Prenatal Vit-Fe Fumarate-FA (PRENATAL MULTIVITAMIN) TABS Take 1 tablet by mouth daily.    [provider]    Family History Family History  Problem Relation Age of Onset  . Heart disease Father   . Diabetes Maternal Grandmother   . Heart disease Maternal Grandmother     Social History Social History  Substance Use Topics  . Smoking status: Never Smoker  . Smokeless tobacco: Never Used  . Alcohol use No     Allergies   Sulfa antibiotics   Review of Systems Review of Systems  All other systems reviewed and are negative.    Physical Exam Updated Vital Signs BP 134/78 (BP Location: Right Arm)   Pulse 93   Temp 97.9 F (36.6 C) (Oral)   Resp 18   Ht  (1.702 m)   Wt 86.2 kg (190 lb)   SpO2 95%  BMI 29.76 kg/m   Physical Exam  Nursing note and vitals reviewed.  31 year old female, resting comfortably and in no acute distress. Vital signs are normal. Oxygen saturation is 95%, which is normal. Head is normocephalic and atraumatic. PERRLA, EOMI. Oropharynx is clear. Neck is nontender and supple without adenopathy or JVD. Back is nontender in the midline. There is moderate right CVA tenderness. Lungs are clear without rales, wheezes, or rhonchi. Chest is nontender. Heart has regular rate and rhythm without murmur. Abdomen is soft, flat, nontender without masses or hepatosplenomegaly and peristalsis is hypoactive. Extremities have no cyanosis or edema, full range of motion is present. Skin is warm and dry without rash. Neurologic: Mental status is normal, cranial nerves are intact,  there are no motor or sensory deficits.  ED Treatments / Results  Labs (all labs ordered are listed, but only abnormal results are displayed) Labs Reviewed  URINALYSIS, ROUTINE W REFLEX MICROSCOPIC - Abnormal; Notable for the following:       Result Value   Color, Urine AMBER (*)    Hgb urine dipstick MODERATE (*)    Protein, ur 30 (*)    Nitrite POSITIVE (*)    Bacteria, UA RARE (*)    Squamous Epithelial / LPF 0-5 (*)    All other components within normal limits  CBC WITH DIFFERENTIAL/PLATELET - Abnormal; Notable for the following:    WBC 11.9 (*)    Hemoglobin 11.8 (*)    HCT 33.4 (*)    Neutro Abs 10.6 (*)    All other components within normal limits  COMPREHENSIVE METABOLIC PANEL - Abnormal; Notable for the following:    Glucose, Bld 131 (*)    All other components within normal limits  LIPASE, BLOOD  I-STAT BETA HCG BLOOD, ED ([facility], [facility], [facility] ONLY)   Radiology Ct Renal Stone Study  Result Date: 12/11/2016 CLINICAL DATA:  Rt flank pain since 2200 hrs, feels she cant fully empty her bladder, increased urinary urgency and frequency, no gross hematuria, urinalysis pending, hx of DANDC of uterus and IUD, no prev ct's a/p low dose stone protocol # 1, pt wt 190 lbs EXAM: CT ABDOMEN AND PELVIS WITHOUT CONTRAST TECHNIQUE: Multidetector CT imaging of the abdomen and pelvis was performed following the standard protocol without IV contrast. COMPARISON:  Chest radiograph 04/02/2015 FINDINGS: Lower chest: Unremarkable Hepatobiliary: Unremarkable Pancreas: Unremarkable Spleen: Adrenals/Urinary Tract: Moderate right hydronephrosis and prominent right hydroureter extending down to a 4 mm distal ureteral calculus situated about 1.5 cm proximal to the right UVJ. No additional urinary tract calculi. Stomach/Bowel: Unremarkable.  Appendix normal. Vascular/Lymphatic: Unremarkable Reproductive: Retroverted uterus. IUD in satisfactory position. 1.7 cm in long axis cyst or follicle of the left ovary. Other: No  supplemental non-categorized findings. Musculoskeletal: L3 limbus vertebra.  Small disc bulge at L1-2. IMPRESSION: 1. Moderate right hydronephrosis and prominent right hydroureter extending down to a 4 mm right distal ureteral calculus situated about 15 mm proximal to the right UVJ. No other urinary tract calculi. Electronically Signed   By: Gaylyn Rong M.D.   On: 12/11/2016 07:38    Procedures Procedures (including critical care time)  Medications Ordered in ED Medications  ibuprofen (ADVIL,MOTRIN) tablet 400 mg (400 mg Oral Given 12/11/16 0456)  sodium chloride 0.9 % bolus 1,000 mL (1,000 mLs Intravenous New Bag/Given 12/11/16 0622)  morphine 4 MG/ML injection 4 mg (4 mg Intravenous Given 12/11/16 0622)  ondansetron (ZOFRAN) injection 4 mg (4 mg Intravenous Given 12/11/16 0622)     Initial  Impression / Assessment and Plan / ED Course  I have reviewed the triage vital signs and the nursing notes.  Pertinent labs & imaging results that were available during my care of the patient were reviewed by me and considered in my medical decision making (see chart for details).  Right flank pain suspicious for renal colic. Consider pyelonephritis. She'll be given IV fluids, ondansetron, morphine and will be sent for renal stone protocol CT scan. Old records are reviewed, and she has no prior abdominal imaging studies.  She feels much better after above noted treatment. CT confirms 4 mm distal right ureteral calculus with hydronephrosis. She is discharged with prescriptions for tamsulosin,oxycodone have acetaminophen, and ondansetron. Referred to urology for follow-up. Return precautions discussed.  Final Clinical Impressions(s) / ED Diagnoses   Final diagnoses:  Ureteral colic  Ureterolithiasis    New Prescriptions New Prescriptions   ONDANSETRON (ZOFRAN) 4 MG TABLET    Take 1 tablet (4 mg total) by mouth every 6 (six) hours as needed for nausea.   OXYCODONE-ACETAMINOPHEN (PERCOCET) 5-325  MG TABLET    Take 1 tablet by mouth every 4 (four) hours as needed for moderate pain.   TAMSULOSIN (FLOMAX) 0.4 MG CAPS CAPSULE    Take 1 capsule (0.4 mg total) by mouth daily.     Dione Booze, MD 12/11/16 [phone]

## 2018-04-21 NOTE — Progress Notes (Signed)
Cardiology Office Note:    Date:  04/22/2018   ID:  [name], DOB [birth year], MRN [id number]  PCP:  Leane Call, PA-C  Cardiologist:  Norman Herrlich, MD   Referring MD: Leane Call, PA-C  ASSESSMENT:    1. Heart murmur    PLAN:    In order of problems listed above:  1. Intermittent likely mitral valve prolapse further evaluation echocardiogram should be done at [facility] as there are no indication for endocarditis prophylaxis  Next appointment as needed   Medication Adjustments/Labs and Tests Ordered: Current medicines are reviewed at length with the patient today.  Concerns regarding medicines are outlined above.  Orders Placed This Encounter  Procedures  . EKG 12-Lead  . ECHOCARDIOGRAM COMPLETE   No orders of the defined types were placed in this encounter.    Chief Complaint  Patient presents with  . Heart Murmur    History of Present Illness:    Diana Warren is a 33 y.o. female who is being seen today for the evaluation of heart murmur at the request of Nodal, Joline Salt, PA-C.  During a GYN office visit heart murmur was noted also followed by her PCP and she is here today regarding.  She has no history of congenital heart disease although was a baby her mother told her she transiently had a heart murmur there is no family history of aortopathy Marfan's of bicuspid aortic valve.  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  She has had no fever or chills. Past Medical History:  Diagnosis Date  . Medical history non-contributory     Past Surgical History:  Procedure Laterality Date  . DILATION AND CURETTAGE OF UTERUS    . DILATION AND EVACUATION  02/27/2011   Procedure: DILATATION AND EVACUATION;  Surgeon: Loney Laurence;  Location: [facility];  Service: Gynecology;  Laterality: N/A;  . LITHOTRIPSY    .  MOUTH SURGERY      Current Medications: Current Meds  Medication Sig  . busPIRone (BUSPAR) 5 MG tablet Take 5 mg by mouth daily as needed.   . fluticasone (FLONASE) 50 MCG/ACT nasal spray Place 2 sprays into the nose daily.  Marland Kitchen loratadine (CLARITIN) 10 MG tablet Take 10 mg by mouth daily.  . montelukast (SINGULAIR) 10 MG tablet Take 10 mg by mouth at bedtime.     Allergies:   Sulfa antibiotics   Social History   Socioeconomic History  . Marital status: Married    Spouse name: Not on file  . Number of children: Not on file  . Years of education: Not on file  . Highest education level: Not on file  Occupational History  . Not on file  Social Needs  . Financial resource strain: Not on file  . Food insecurity:    Worry: Not on file    Inability: Not on file  . Transportation needs:    Medical: Not on file    Non-medical: Not on file  Tobacco Use  . Smoking status: Never Smoker  . Smokeless tobacco: Never Used  Substance and Sexual Activity  . Alcohol use: No  . Drug use: No  . Sexual activity: Never  Lifestyle  . Physical activity:    Days per week: Not on file    Minutes per session: Not on file  . Stress: Not on file  Relationships  . Social connections:    Talks on phone: Not on file    Gets together: Not on file    Attends religious service: Not on file    Active member of club or organization: Not on file    Attends meetings of clubs or organizations: Not on file    Relationship status: Not on file  Other Topics Concern  . Not on file  Social History Narrative  . Not on file     Family History: The patient's family history includes Diabetes in her maternal grandmother; Heart disease in her father and maternal grandmother.  ROS:   Review of Systems  Constitution: Positive for malaise/fatigue.  HENT: Negative.   Eyes: Negative.   Cardiovascular: Negative.   Respiratory: Negative.   Endocrine: Positive for heat intolerance.  Hematologic/Lymphatic:  Negative.   Skin: Positive for dry skin.  Musculoskeletal: Negative.   Gastrointestinal: Negative.   Genitourinary: Negative.   Neurological: Negative.   Psychiatric/Behavioral: The patient is nervous/anxious.   Allergic/Immunologic: Negative.    Please see the history of present illness.     All other systems reviewed and are negative.  EKGs/Labs/Other Studies Reviewed:    The following studies were reviewed today:     Recent Labs: Show normal CMP normal CBC TSH T4 T3 and A1c No results found for requested labs within last 8760 hours.  Recent Lipid Panel No results found for: CHOL, TRIG, HDL, CHOLHDL, VLDL, LDLCALC, LDLDIRECT  Physical Exam:    VS:  BP 120/70 (BP Location: Right Arm, Patient Position: Sitting, Cuff Size: Normal)   Pulse 89   Ht 5\' 7"  (1.702 m)   Wt 190 lb (86.2 kg)   SpO2 98%   BMI 29.76 kg/m     Wt Readings from Last 3 Encounters:  04/22/18 190 lb (86.2 kg)  12/11/16 190 lb (86.2 kg)  08/21/12 188 lb (85.3 kg)     GEN:  Well nourished, well developed in no acute distress HEENT: Normal NECK: No JVD; No carotid bruits LYMPHATICS: No lymphadenopathy CARDIAC: On examination she has normal heart sounds and I cannot detect a murmur or click today there is no chest wall or thoracic deformity RRR, no murmurs, rubs, gallops RESPIRATORY:  Clear to auscultation without rales, wheezing or rhonchi  ABDOMEN: Soft, non-tender, non-distended MUSCULOSKELETAL:  No edema; No deformity  SKIN: Warm and dry NEUROLOGIC:  Alert and oriented x 3 PSYCHIATRIC:  Normal affect     Signed, Norman Herrlich, MD  04/22/2018 4:09 PM    [facility]

## 2018-04-22 ENCOUNTER — Ambulatory Visit: Payer: Commercial Managed Care - PPO | Admitting: Cardiology

## 2018-04-22 ENCOUNTER — Encounter: Payer: Self-pay | Admitting: Cardiology

## 2018-04-22 VITALS — BP 120/70 | HR 89 | Ht 67.0 in | Wt 190.0 lb

## 2018-04-22 DIAGNOSIS — R011 Cardiac murmur, unspecified: Secondary | ICD-10-CM | POA: Diagnosis not present

## 2018-04-22 NOTE — Patient Instructions (Signed)
Medication Instructions:  Your physician recommends that you continue on your current medications as directed. Please refer to the Current Medication list given to you today.  If you need a refill on your cardiac medications before your next appointment, please call your pharmacy.   Lab work: NONE If you have labs (blood work) drawn today and your tests are completely normal, you will receive your results only by: Marland Kitchen MyChart Message (if you have MyChart) OR . A paper copy in the mail If you have any lab test that is abnormal or we need to change your treatment, we will call you to review the results.  Testing/Procedures: Your physician has requested that you have an echocardiogram. Echocardiography is a painless test that uses sound waves to create images of your heart. It provides your doctor with information about the size and shape of your heart and how well your heart's chambers and valves are working. This procedure takes approximately one hour. There are no restrictions for this procedure.    Follow-Up: At [facility], you and your health needs are our priority.  As part of our continuing mission to provide you with exceptional heart care, we have created designated Provider Care Teams.  These Care Teams include your primary Cardiologist (physician) and Advanced Practice Providers (APPs -  Physician Assistants and Nurse Practitioners) who all work together to provide you with the care you need, when you need it. You will need a follow up appointment in as needed or if symptoms worsen or fail to improve.

## 2018-04-29 DIAGNOSIS — R011 Cardiac murmur, unspecified: Secondary | ICD-10-CM | POA: Diagnosis not present

## 2018-08-18 IMAGING — CT CT RENAL STONE PROTOCOL
2 of 3 series · 16 of 46 positions shown, 18 images · non-contrast
Comparison: Chest radiograph 04/02/2015

CLINICAL DATA: Rt flank pain since 2222 hrs, feels she cant fully
empty her bladder, increased urinary urgency and frequency, no gross
hematuria, urinalysis pending, hx of DANDC of uterus and IUD, no
prev ct's a/p low dose stone protocol # 1, pt wt 190 lbs

EXAM:
CT ABDOMEN AND PELVIS WITHOUT CONTRAST
TECHNIQUE: Multidetector CT imaging of the abdomen and pelvis was performed
following the standard protocol without IV contrast.

[Series 3: lung · axial · 0.67mm/px · z∈[-111,-43]mm · 13 of 40 slices shown, 15 images]
[im 3/40  soft-tissue]
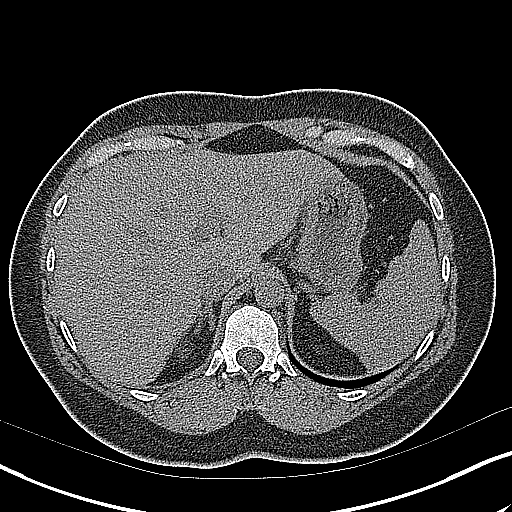
[im 3/40  bone]
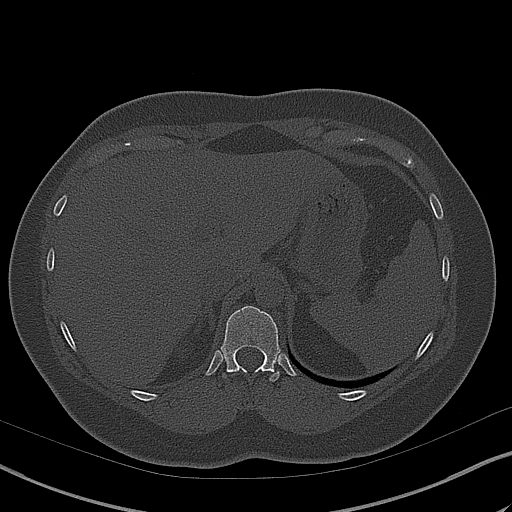
[im 6/40  soft-tissue]
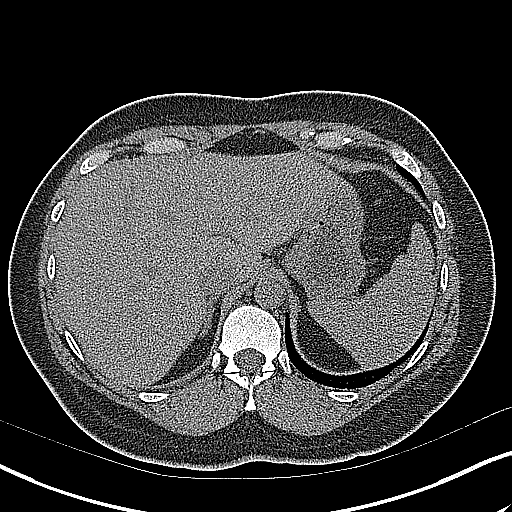
[im 8/40  soft-tissue]
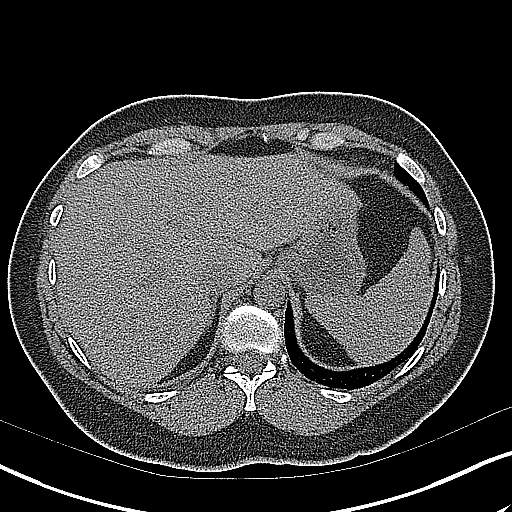
[im 12/40  soft-tissue]
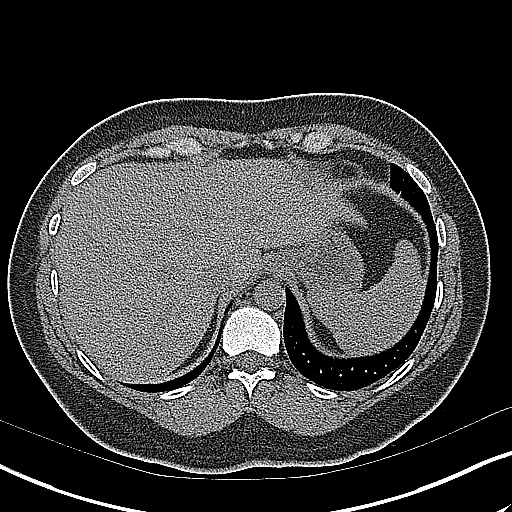
[im 14/40  soft-tissue]
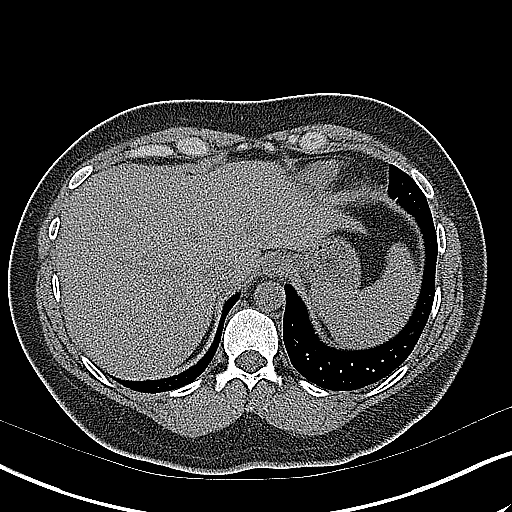
[im 17/40  soft-tissue]
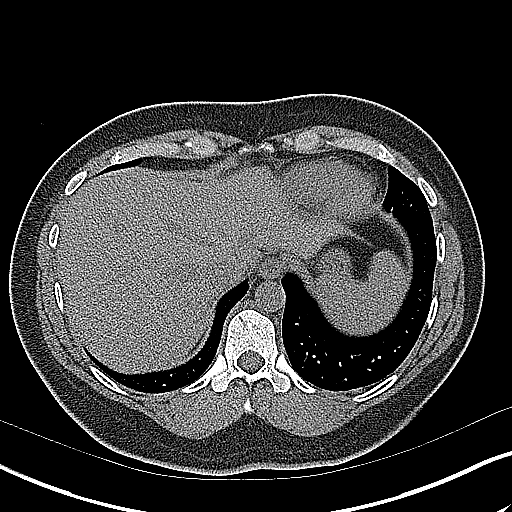
[im 21/40  soft-tissue]
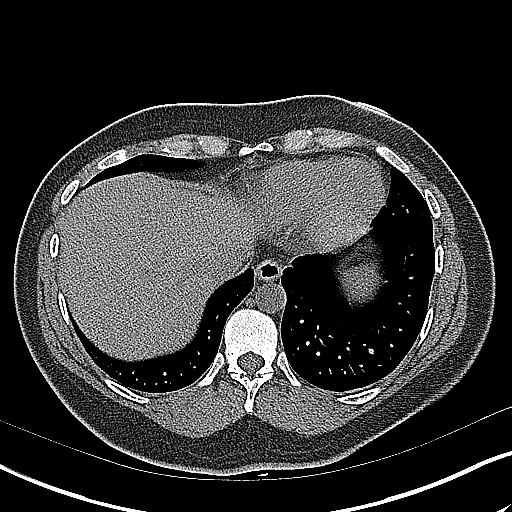
[im 23/40  soft-tissue]
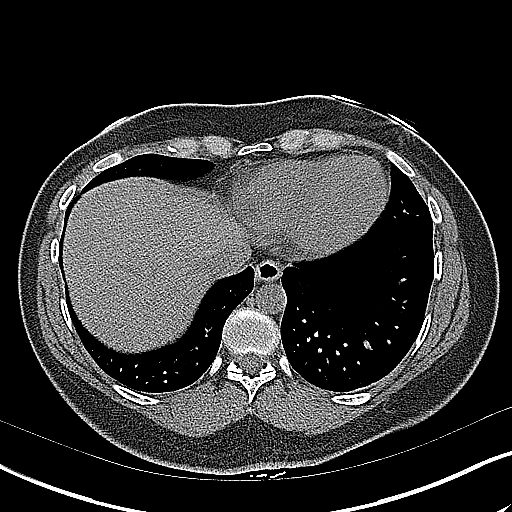
[im 26/40  soft-tissue]
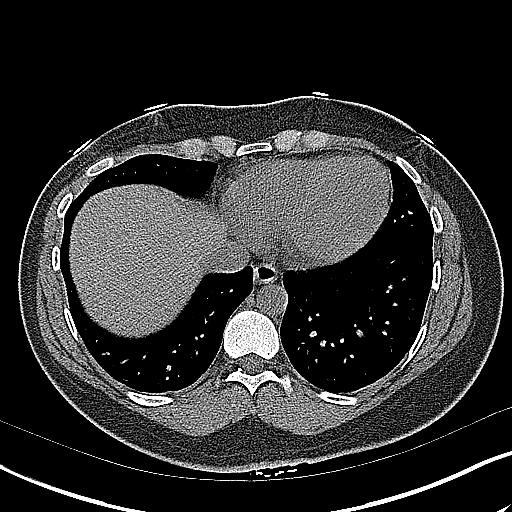
[im 26/40  bone]
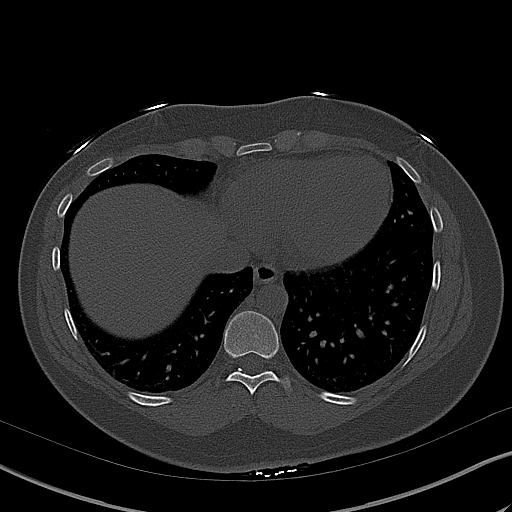
[im 28/40  soft-tissue]
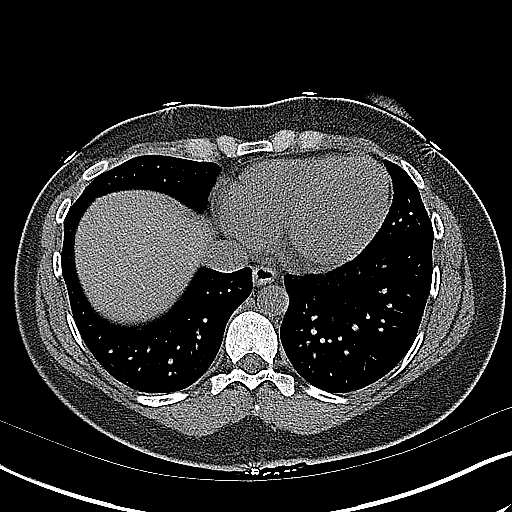
[im 32/40  soft-tissue]
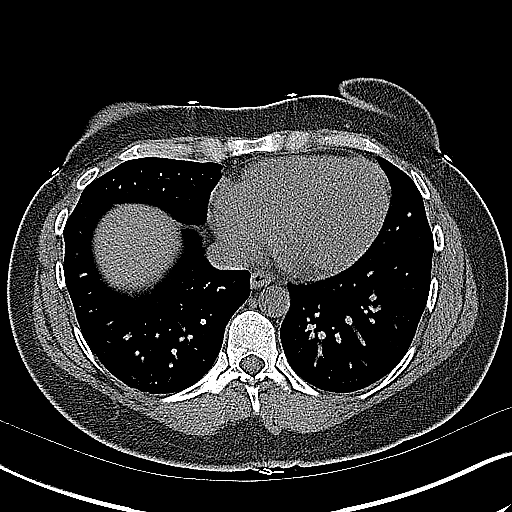
[im 34/40  soft-tissue]
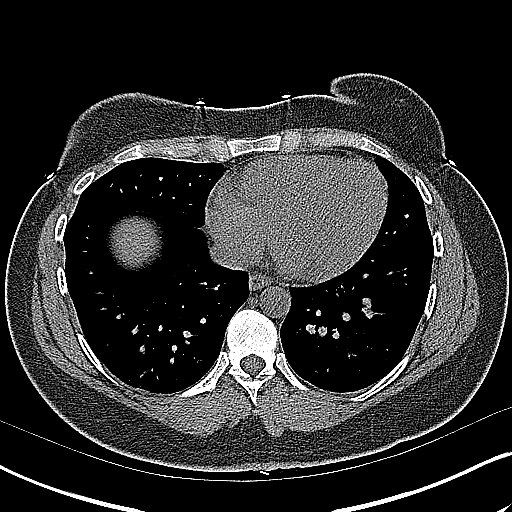
[im 37/40  soft-tissue]
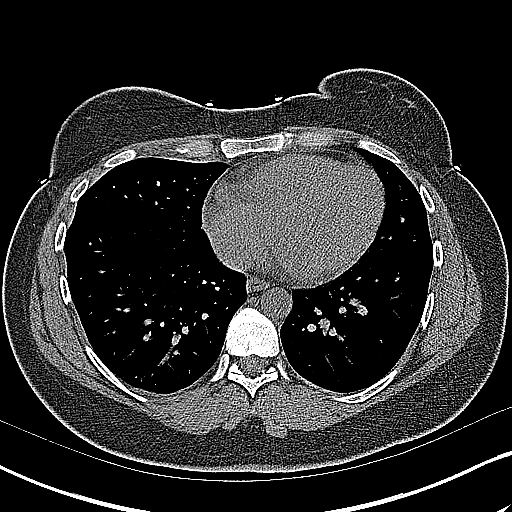

[Series 4: coronal · coronal · 0.66mm/px · 3 of 117 slices shown]
[im 39/117  soft-tissue]
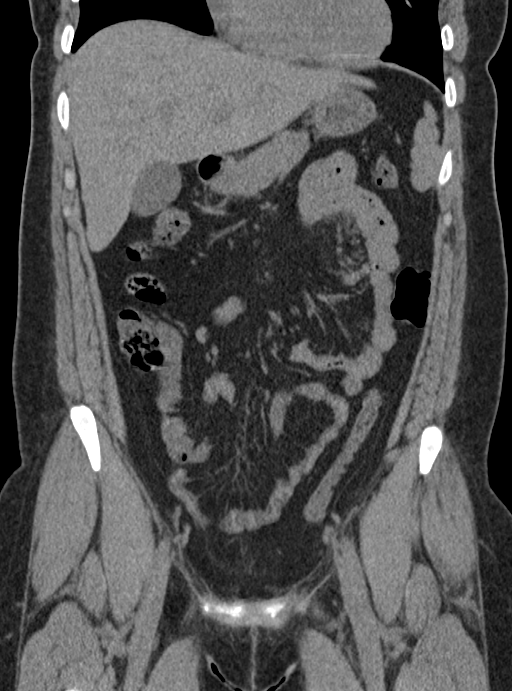
[im 52/117  soft-tissue]
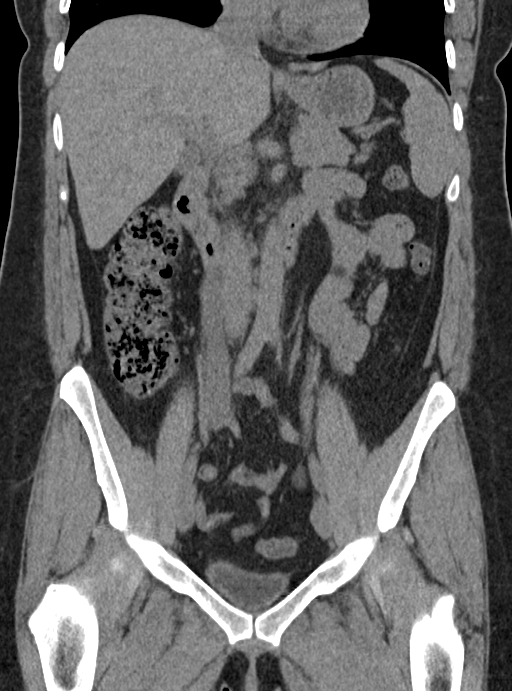
[im 65/117  soft-tissue]
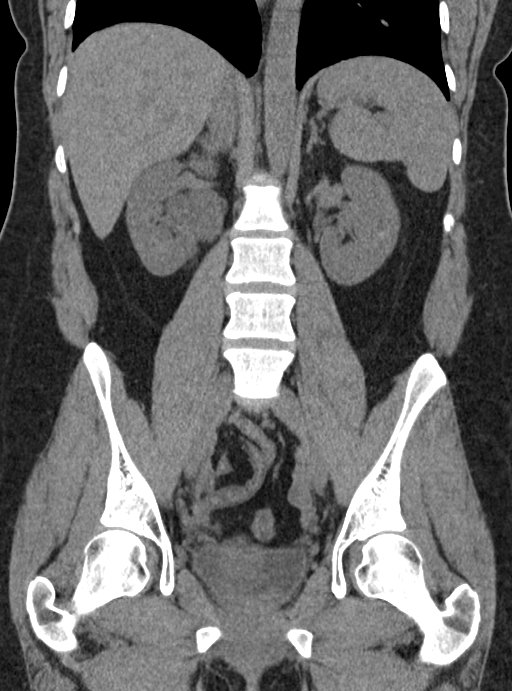

[16 of 46 positions shown; findings below may reference images not displayed]

FINDINGS: Lower chest: Unremarkable

Hepatobiliary: Unremarkable

Pancreas: Unremarkable

Spleen:

Adrenals/Urinary Tract: Moderate right hydronephrosis and prominent
right hydroureter extending down to a 4 mm distal ureteral calculus
situated about 1.5 cm proximal to the right UVJ. No additional
urinary tract calculi.

Stomach/Bowel: Unremarkable.  Appendix normal.

Vascular/Lymphatic: Unremarkable

Reproductive: Retroverted uterus. IUD in satisfactory position.
cm in long axis cyst or follicle of the left ovary.

Other: No supplemental non-categorized findings.

Musculoskeletal: L3 limbus vertebra.  Small disc bulge at L1-2.
IMPRESSION: 1. Moderate right hydronephrosis and prominent right hydroureter
extending down to a 4 mm right distal ureteral calculus situated
about 15 mm proximal to the right UVJ. No other urinary tract
calculi.

## 2019-04-28 LAB — HM PAP SMEAR: HM Pap smear: NEGATIVE

## 2020-10-15 DIAGNOSIS — U071 COVID-19: Secondary | ICD-10-CM

## 2020-10-15 HISTORY — DX: COVID-19: U07.1

## 2020-12-20 ENCOUNTER — Encounter: Payer: Self-pay | Admitting: *Deleted

## 2020-12-20 ENCOUNTER — Other Ambulatory Visit: Payer: Self-pay | Admitting: *Deleted

## 2020-12-25 ENCOUNTER — Encounter: Payer: Self-pay | Admitting: Psychiatry

## 2020-12-25 ENCOUNTER — Ambulatory Visit: Payer: BC Managed Care – PPO | Admitting: Psychiatry

## 2020-12-25 VITALS — BP 124/77 | HR 87 | Ht 67.0 in | Wt 194.8 lb

## 2020-12-25 DIAGNOSIS — R519 Headache, unspecified: Secondary | ICD-10-CM | POA: Diagnosis not present

## 2020-12-25 DIAGNOSIS — U099 Post covid-19 condition, unspecified: Secondary | ICD-10-CM | POA: Diagnosis not present

## 2020-12-25 DIAGNOSIS — G8929 Other chronic pain: Secondary | ICD-10-CM

## 2020-12-25 MED ORDER — ELETRIPTAN HYDROBROMIDE 40 MG PO TABS: 40.0000 mg | ORAL_TABLET | ORAL | 0 refills | Status: DC | PRN

## 2020-12-25 MED ORDER — QULIPTA 60 MG PO TABS: 60.0000 mg | ORAL_TABLET | Freq: Every day | ORAL | 3 refills | Status: DC

## 2020-12-25 NOTE — Progress Notes (Signed)
Referring:  Leane Call, PA-C [address] [place],  [place] [address]  PCP: Leane Call, PA-C  Neurology was asked to evaluate Diana Warren, a 35 year old female for a chief complaint of headaches.  Our recommendations of care will be communicated by shared medical record.    CC:  headaches  HPI:  Medical co-morbidities: kidney stones, depression  The patient presents for evaluation of daily headaches which began after COVID infection on October 16, 2020. Severe headaches were her first COVID symptom, then she developed fever and chills. Since COVID she has had headaches every day. They are described as a dull aching and throbbing in her right temple which can range from 4-10/10 severity. They are associated with phonophobia and worsened by physical activity. She does have pain free periods between her headaches, but has not gone more than 24 hours without one.  Recently tried Zonisamide for headache prevention which made her feel worse. Also tried steroids, toradol, muscle relaxers, and trigger point injections where were ineffective. Was given a sample of Qulipta which did relieve her headache, but it returned as soon as she ran out of medication.  Headache History: Onset: October 16, 2020 Triggers: loud noises Aura: no Location: right temple Quality/Description: dull ache, throbbing Severity: 4/10, can get up to 10/10 2 times per week Associated Symptoms:  Photophobia: no  Phonophobia: loud noises  Nausea: no Worse with activity?: yes Duration of headaches: 24 hours  Headache days per month: 30 Headache free days per month: 0  Current Treatment: Abortive none  Preventative none  Prior Therapies                                 Buspirone 5 mg TID Fluoxetine 40 mg daily Fioricet PRN Qulipta 60 mg - samples worked but ran out Baclofen Toradol Decadron Zonisamide  Headache Risk Factors: Headache risk factors and/or co-morbidities (-)  Neck Pain (-) History of Motor Vehicle Accident (-) Sleep Disorder (-) History of Traumatic Brain Injury and/or Concussion  LABS: 06/29/20 CBC, CMP, TSH wnl  IMAGING:  N/a   Current Outpatient Medications on File Prior to Visit  Medication Sig Dispense Refill   busPIRone (BUSPAR) 5 MG tablet Take 5 mg by mouth daily as needed.      cetirizine (ZYRTEC) 10 MG tablet Take 10 mg by mouth daily.     FLUoxetine (PROZAC) 40 MG capsule Take 40 mg by mouth daily.     fluticasone (FLONASE) 50 MCG/ACT nasal spray Place 2 sprays into the nose daily.     levonorgestrel (MIRENA, 52 MG,) 20 MCG/DAY IUD Mirena 20 mcg/24 hours (8 yrs) 52 mg intrauterine device  Take 1 device by intrauterine route.     butalbital-acetaminophen-caffeine (FIORICET) 50-325-40 MG tablet Take by mouth. (Patient not taking: Reported on 12/25/2020)     famotidine (PEPCID) 20 MG tablet Take by mouth. (Patient not taking: Reported on 12/25/2020)     montelukast (SINGULAIR) 10 MG tablet Take 10 mg by mouth at bedtime. (Patient not taking: Reported on 12/25/2020)     No current facility-administered medications on file prior to visit.     Allergies: Allergies  Allergen Reactions   Sulfa Antibiotics Swelling    Family History: Migraine or other headaches in the family:  no Aneurysms in a first degree relative:  no Brain tumors in the family:  no Other neurological illness in the family:  no  Past Medical History: Past Medical History:  Diagnosis Date   COVID 10/2020   Frequent headaches    Medical history non-contributory     Past Surgical History Past Surgical History:  Procedure Laterality Date   DILATION AND CURETTAGE OF UTERUS     DILATION AND EVACUATION  02/27/2011   Procedure: DILATATION AND EVACUATION;  Surgeon: Loney Laurence;  Location: [facility];  Service: Gynecology;  Laterality: N/A;   LITHOTRIPSY     MOUTH SURGERY      Social History: Social History   Tobacco Use   Smoking status: Never    Smokeless tobacco: Never  Vaping Use   Vaping Use: Never used  Substance Use Topics   Alcohol use: No   Drug use: No    ROS: Negative for fevers, chills. Positive for headaches. All other systems reviewed and negative unless stated otherwise in HPI.   Physical Exam:   Vital Signs: BP 124/77   Pulse 87   Ht 5\' 7"  (1.702 m)   Wt 194 lb 12.8 oz (88.4 kg)   BMI 30.51 kg/m  GENERAL: well appearing,in no acute distress,alert SKIN:  Color, texture, turgor normal. No rashes or lesions HEAD:  Normocephalic/atraumatic. CV:  RRR RESP: Normal respiratory effort MSK: no tenderness to palpation over occiput, neck, or shoulders  NEUROLOGICAL: Mental Status: Alert, oriented to person, place and time,Follows commands Cranial Nerves: PERRL, optic discs sharp, visual fields intact to confrontation,extraocular movements intact,facial sensation intact,no facial droop or ptosis,hearing intact to finger rub bilaterally,no dysarthria,palate elevate symmetrically,tongue protrudes midline,shoulder shrug intact and symmetric Motor: muscle strength 5/5 both upper and lower extremities,no drift, normal tone Reflexes: 2+ throughout Sensation: intact to light touch all 4 extremities Coordination: Finger-to- nose-finger intact bilaterally,Heel-to-shin intact bilaterally Gait: normal-based   IMPRESSION: 35 year old female with a history of nephrolithiasis and depression who presents for daily right-sided headaches which began following a COVID infection in August. Headaches have some migrainous features including photophobia and aggravation by movement. Will order MRI brain to assess for underlying structural causes for new daily side-locked headache. If imaging is normal will treat for post-COVID headaches. She has previously responded to Qulipta samples, will start Qulipta daily for headache prevention. Relpax prescribed for migraine rescue.  PLAN: -MRI brain with contrast -Prevention: Start Qulipta  60 mg daily -Rescue: Start Relpax 40 mg PRN -Next steps: consider propranolol, CGRP injectable, Botox, gabapentin for prevention  I spent a total of 35 minutes chart reviewing and counseling the patient, with at least 50% of the time spent face to face with the patient. Headache education was done. Discussed treatment options including preventive and acute medications.  Discussed medication overuse headache and to limit use of acute treatments to no more than 2 days/week or 10 days/month. Discussed medication side effects, adverse reactions and drug interactions. Written educational materials and patient instructions outlining all of the above were given.  Follow-up: 3 months   September, MD 12/25/2020   3:42 PM

## 2020-12-25 NOTE — Patient Instructions (Signed)
MRI brain Start Relpax 40 mg as needed for migraine. Take at the onset of migraine. If headache recurs or does not fully resolve, you may take a second dose after 2 hours. Please avoid taking more than 2 days per week or 10 days per month. Start Qulipta 60 mg daily for migraine prevention

## 2021-01-03 ENCOUNTER — Telehealth: Payer: Self-pay | Admitting: Psychiatry

## 2021-01-03 DIAGNOSIS — H0553 Retained (old) foreign body following penetrating wound of bilateral orbits: Secondary | ICD-10-CM

## 2021-01-03 NOTE — Telephone Encounter (Signed)
Sent mychart message

## 2021-01-07 NOTE — Telephone Encounter (Signed)
Noted, thank you patient states she will go tomorrow.

## 2021-01-07 NOTE — Telephone Encounter (Signed)
I have placed order for Orbits. Thanks!

## 2021-01-07 NOTE — Addendum Note (Signed)
Addended by: Ann Maki on: 01/07/2021 09:36 AM   Modules accepted: Orders

## 2021-01-07 NOTE — Telephone Encounter (Signed)
MR Brain w/wo contrast Dr. Oda Kilts Berkley Harvey: NPR via bcbs website. Patient is scheduled at [facility] for 01/15/21.  Patient did inform me she has had metal in her eyes before, I informed her she would need to get a xray done before her appointment just to be make sure. Please put in the DG orbits orders in thank you.

## 2021-01-11 ENCOUNTER — Ambulatory Visit
Admission: RE | Admit: 2021-01-11 | Discharge: 2021-01-11 | Disposition: A | Discharge status: Home / Self Care | Payer: BLUE CROSS/BLUE SHIELD | Source: Ambulatory Visit | Attending: Psychiatry | Admitting: Psychiatry

## 2021-01-11 DIAGNOSIS — H0553 Retained (old) foreign body following penetrating wound of bilateral orbits: Secondary | ICD-10-CM

## 2021-01-15 ENCOUNTER — Ambulatory Visit: Payer: BC Managed Care – PPO

## 2021-01-15 DIAGNOSIS — R519 Headache, unspecified: Secondary | ICD-10-CM | POA: Diagnosis not present

## 2021-01-15 MED ORDER — GADOBENATE DIMEGLUMINE 529 MG/ML IV SOLN
20.0000 mL | Freq: Once | INTRAVENOUS | Status: AC | PRN
  Administered 2021-01-15: 20 mL via INTRAVENOUS

## 2021-04-02 ENCOUNTER — Ambulatory Visit: Payer: BC Managed Care – PPO | Admitting: Psychiatry

## 2021-04-29 ENCOUNTER — Telehealth: Payer: Self-pay | Admitting: Psychiatry

## 2021-04-29 NOTE — Telephone Encounter (Signed)
Rescheduled due to Dr. Billey Gosling out that week

## 2021-05-03 ENCOUNTER — Other Ambulatory Visit: Payer: Self-pay | Admitting: Psychiatry

## 2021-05-03 DIAGNOSIS — R519 Headache, unspecified: Secondary | ICD-10-CM

## 2021-05-15 ENCOUNTER — Ambulatory Visit: Payer: BC Managed Care – PPO | Admitting: Psychiatry

## 2021-05-21 ENCOUNTER — Ambulatory Visit: Payer: BC Managed Care – PPO | Admitting: Psychiatry

## 2021-06-11 ENCOUNTER — Ambulatory Visit: Payer: BC Managed Care – PPO | Admitting: Psychiatry

## 2021-06-11 ENCOUNTER — Encounter: Payer: Self-pay | Admitting: Psychiatry

## 2021-06-11 VITALS — BP 115/68 | HR 69 | Ht 67.0 in | Wt 199.0 lb

## 2021-06-11 DIAGNOSIS — G43019 Migraine without aura, intractable, without status migrainosus: Secondary | ICD-10-CM

## 2021-06-11 MED ORDER — DICLOFENAC POTASSIUM 50 MG PO TABS: ORAL_TABLET | ORAL | 3 refills | Status: DC

## 2021-06-11 NOTE — Progress Notes (Signed)
? ?  CC:  headaches ? ?Follow-up Visit ? ?Last visit: 12/25/20 ? ?Brief HPI: ?36 year old female with a history of kidney stones and depression who follows in clinic for chronic right sided headaches following COVID infection. ? ?At her last visit MRI brain was ordered. Bennie Pierini was started for prevention and Relpax was started for rescue. ? ?Interval History: ?Since her last visit her headaches have improved. She has had 2 migraines in the past month. Relpax hasn't helped much, so she usually takes ibuprofen as needed. She tolerates Qulipta well without side effects. ? ?Brain MRI 01/15/21 was normal. ? ? ?Headache days per month: 2 ?Headache free days per month: 28 ? ?Current Headache Regimen: ?Preventative: Qulipta 60 mg daily ?Abortive: Ibuprofen ? ? ?Prior Therapies                                  ?Buspirone 5 mg TID ?Fluoxetine 40 mg daily ?Fioricet PRN ?Qulipta 60 mg - samples worked but ran out ?Baclofen ?Toradol ?Decadron ?Zonisamide ?Relpax - lack of efficacy ? ?Physical Exam:  ? ?Vital Signs: ?BP 115/68   Pulse 69   Ht 5\' 7"  (1.702 m)   Wt 199 lb (90.3 kg)   BMI 31.17 kg/m?  ?GENERAL:  well appearing, in no acute distress, alert  ?SKIN:  Color, texture, turgor normal. No rashes or lesions ?HEAD:  Normocephalic/atraumatic. ?RESP: normal respiratory effort ?MSK:  No gross joint deformities.  ? ?NEUROLOGICAL: ?Mental Status: Alert, oriented to person, place and time, Follows commands, and Speech fluent and appropriate. ?Cranial Nerves: PERRL, face symmetric, no dysarthria, hearing grossly intact ?Motor: moves all extremities equally ?Gait: normal-based. ? ?IMPRESSION: ?36 year old female with a history of kidney stones and depression who presents for follow up of migraines. MRI brain was normal. She has gone from daily headaches to 2 migraines per month on Qulipta. Will switch to diclofenac for rescue as she has had more improvement with NSAIDs than with Relpax. ? ?PLAN: ?-Prevention: Continue Qulipta 60 mg  daily ?-Rescue: Start diclofenac 50-100 mg PRN ? ?Follow-up: 1 year or sooner if needed ? ?I spent a total of 13 minutes on the date of the service. Discussed treatment options including acute medications. Discussed medication side effects, adverse reactions and drug interactions. Written educational materials and patient instructions outlining all of the above were given. ? ?02-24-1973, MD ?06/11/21 ?2:58 PM ? ?

## 2021-06-11 NOTE — Patient Instructions (Signed)
Start diclofenac 50-100 mg as needed for migraine ?

## 2021-06-13 ENCOUNTER — Other Ambulatory Visit: Payer: Self-pay | Admitting: Psychiatry

## 2021-06-13 DIAGNOSIS — R519 Headache, unspecified: Secondary | ICD-10-CM

## 2021-07-26 ENCOUNTER — Other Ambulatory Visit: Payer: Self-pay | Admitting: Psychiatry

## 2021-07-26 DIAGNOSIS — R519 Headache, unspecified: Secondary | ICD-10-CM

## 2021-09-30 ENCOUNTER — Telehealth: Payer: Self-pay | Admitting: *Deleted

## 2021-09-30 NOTE — Telephone Encounter (Signed)
Bennie Pierini PA, Key: [id number] , continuation of therapy. Your information has been sent to [employer] [facility] [place].

## 2021-09-30 NOTE — Telephone Encounter (Signed)
[place] PA Case: [id number], Status: Approved, Coverage Starts on: 09/30/2021 12:00:00 AM, Coverage Ends on: 09/30/2023 12:00:00 AM.

## 2021-10-21 ENCOUNTER — Other Ambulatory Visit: Payer: Self-pay | Admitting: *Deleted

## 2021-10-21 DIAGNOSIS — R519 Headache, unspecified: Secondary | ICD-10-CM

## 2021-10-21 MED ORDER — QULIPTA 60 MG PO TABS: 1.0000 | ORAL_TABLET | Freq: Every day | ORAL | 2 refills | Status: DC

## 2022-02-26 ENCOUNTER — Other Ambulatory Visit: Payer: Self-pay | Admitting: Neurology

## 2022-02-26 DIAGNOSIS — R519 Headache, unspecified: Secondary | ICD-10-CM

## 2022-02-26 MED ORDER — QULIPTA 60 MG PO TABS: 1.0000 | ORAL_TABLET | Freq: Every day | ORAL | 2 refills | Status: DC

## 2022-06-19 ENCOUNTER — Other Ambulatory Visit: Payer: Self-pay | Admitting: *Deleted

## 2022-06-19 DIAGNOSIS — R519 Headache, unspecified: Secondary | ICD-10-CM

## 2022-06-24 ENCOUNTER — Encounter: Payer: Self-pay | Admitting: Psychiatry

## 2022-06-24 ENCOUNTER — Ambulatory Visit (INDEPENDENT_AMBULATORY_CARE_PROVIDER_SITE_OTHER): Payer: BC Managed Care – PPO | Admitting: Psychiatry

## 2022-06-24 VITALS — BP 98/61 | HR 83 | Ht 67.0 in | Wt 149.5 lb

## 2022-06-24 DIAGNOSIS — R519 Headache, unspecified: Secondary | ICD-10-CM

## 2022-06-24 DIAGNOSIS — G43019 Migraine without aura, intractable, without status migrainosus: Secondary | ICD-10-CM

## 2022-06-24 MED ORDER — QULIPTA 60 MG PO TABS
1.0000 | ORAL_TABLET | Freq: Every day | ORAL | 11 refills | Status: DC
Start: 2022-06-24 — End: 2023-06-24

## 2022-06-24 NOTE — Progress Notes (Signed)
   CC:  headaches  Follow-up Visit  Last visit: 06/11/21  Brief HPI: 37 year old female with a history of kidney stones and depression who follows in clinic for chronic right sided headaches following COVID infection. Brain MRI 01/15/21 was normal.   At her last visit she was started diclofenac for rescue. Bennie Pierini was continued for prevention.  Interval History: Headaches remain well-controlled on Qulipta. States she has not had any migraines since her last visit. Will occasionally get lower level headaches which last for a few hours and typically resolve with ibuprofen. She has taken diclofenac a handful of times and it has worked well for rescue.   Headache days per month: 1 Headache free days per month: 29  Current Headache Regimen: Preventative: Qulipta 60 mg daily Abortive: diclofenac 50-100 mg PRN   Prior Therapies                                  Preventive: Buspirone 5 mg TID Fluoxetine 40 mg daily Fioricet PRN Qulipta 60 mg   Rescue: Baclofen Toradol Decadron Zonisamide Relpax - lack of efficacy Diclofenac 50-100 mg PRN    Physical Exam:   Vital Signs: BP 98/61 (BP Location: Left Arm, Patient Position: Sitting, Cuff Size: Normal)   Pulse 83   Ht 5\' 7"  (1.702 m)   Wt 149 lb 8 oz (67.8 kg)   BMI 23.42 kg/m  GENERAL:  well appearing, in no acute distress, alert  SKIN:  Color, texture, turgor normal. No rashes or lesions HEAD:  Normocephalic/atraumatic. RESP: normal respiratory effort MSK:  No gross joint deformities.   NEUROLOGICAL: Mental Status: Alert, oriented to person, place and time, Follows commands, and Speech fluent and appropriate. Cranial Nerves: PERRL, face symmetric, no dysarthria, hearing grossly intact Motor: moves all extremities equally Gait: normal-based.  IMPRESSION: 37 year old female with a history of kidney stones and depression who presents for follow up of migraines. Her headaches remain well-controlled on Qulipta for  prevention and ibuprofen/diclofenac for rescue. Will continue current medication regimen at this time.  PLAN: -Prevention: Continue Qulipta 60 mg daily -Rescue: Continue diclofenac 50-100 mg PRN   Follow-up: 1 year or sooner if needed  I spent a total of 15 minutes on the date of the service. Headache education was done. Discussed medication side effects, adverse reactions and drug interactions. Written educational materials and patient instructions outlining all of the above were given.  Ocie Doyne, MD 06/23/21 2:40 PM

## 2022-11-10 ENCOUNTER — Other Ambulatory Visit (HOSPITAL_COMMUNITY): Payer: Self-pay

## 2022-11-10 ENCOUNTER — Telehealth: Payer: Self-pay

## 2022-11-10 NOTE — Telephone Encounter (Signed)
I received a request via Fax requesting a PA for Qulipta-I tried to run eligibility check but states PT is not covered-Can we please have the PT upload a Pic of their most recent insurance/Pharmacy insurance to the chart or send [place] the RX info such a RX bin, PCN, Group Number and ID please. Thanks.

## 2022-11-11 NOTE — Telephone Encounter (Signed)
Pt has called and reports she will upload insurance to my chart

## 2022-11-21 ENCOUNTER — Other Ambulatory Visit (HOSPITAL_COMMUNITY): Payer: Self-pay

## 2022-11-21 NOTE — Telephone Encounter (Signed)
   Tried to submit to pharmacy benefits and received a rejection-also BCBS is showing not active.

## 2022-11-24 ENCOUNTER — Other Ambulatory Visit (HOSPITAL_COMMUNITY): Payer: Self-pay

## 2022-11-24 NOTE — Telephone Encounter (Signed)
Pt calling checking on PA. Pt said she send [employer] updated insurance card 11/11/22. Would like a call back.

## 2022-11-24 NOTE — Telephone Encounter (Signed)
Monica,  Are you trying with bcbs? Thanks,  Production assistant, radio

## 2022-11-24 NOTE — Telephone Encounter (Signed)
The card loaded into the chart does not have the pharmacy benefits info that I need to do the pa-I will need the following info:  RX Bin: RX PCN: RX Group: RX Member ID

## 2022-11-24 NOTE — Telephone Encounter (Signed)
Phone room,  Contact pt and tell her we need her to bring in or scan in her up to date insurance cards into the system for pa.  If it is blue cross ask her when it will go active because it is not showing as active yet  Thanks,  Luiscarlos Kaczmarczyk

## 2022-11-24 NOTE — Telephone Encounter (Signed)
Phone room please obtain the following information from the pt drug insurance card by calling pt:  RX Bin: RX PCN: RX Group: RX Member ID  Once collected please route to [employer]

## 2022-11-25 ENCOUNTER — Other Ambulatory Visit (HOSPITAL_COMMUNITY): Payer: Self-pay

## 2022-11-25 NOTE — Telephone Encounter (Signed)
Left detailed message on pt voicemail per DPR access about approval

## 2022-11-25 NOTE — Telephone Encounter (Signed)
Monica what do you recommend I do?

## 2022-11-25 NOTE — Telephone Encounter (Addendum)
I called [employer]-Pharmacy Benefits (Unsure why PT doesn't have a pharmacy benefits card)-PT is covered with pharmacy benefits via [employer].  I was able to submit the PA over the phone-Instant Approval  Pharmacy Patient Advocate Encounter  Received notification from [employer] that Prior Authorization for Qulipta 60MG  Tablets has been APPROVED from 10/26/2022 to 11/25/2023. Ran test claim, Copay is $0 per 30DS. This test claim was processed through [facility]- copay amounts may vary at other pharmacies due to pharmacy/plan contracts, or as the patient moves through the different stages of their insurance plan.   PA #/Case ID/Reference #: [id number]

## 2023-04-07 ENCOUNTER — Other Ambulatory Visit: Payer: Self-pay | Admitting: Obstetrics and Gynecology

## 2023-04-07 DIAGNOSIS — N6320 Unspecified lump in the left breast, unspecified quadrant: Secondary | ICD-10-CM

## 2023-04-28 ENCOUNTER — Ambulatory Visit
Admission: RE | Admit: 2023-04-28 | Discharge: 2023-04-28 | Disposition: A | Discharge status: Home / Self Care | Payer: Commercial Managed Care - PPO | Source: Ambulatory Visit | Attending: Obstetrics and Gynecology | Admitting: Obstetrics and Gynecology

## 2023-04-28 ENCOUNTER — Ambulatory Visit
Admission: RE | Admit: 2023-04-28 | Discharge: 2023-04-28 | Disposition: A | Discharge status: Home / Self Care | Payer: BC Managed Care – PPO | Source: Ambulatory Visit | Attending: Obstetrics and Gynecology | Admitting: Obstetrics and Gynecology

## 2023-04-28 DIAGNOSIS — N6320 Unspecified lump in the left breast, unspecified quadrant: Secondary | ICD-10-CM

## 2023-06-22 NOTE — Patient Instructions (Signed)
 Below is our plan:  We will continue to monitor. Use diclofenac as needed and let me know if you have any worsening symptoms.   Please make sure you are staying well hydrated. I recommend 50-60 ounces daily. Well balanced diet and regular exercise encouraged. Consistent sleep schedule with 6-8 hours recommended.   Please continue follow up with care team as directed.   Follow up with me in 1 year   You may receive a survey regarding today's visit. I encourage you to leave honest feed back as I do use this information to improve patient care. Thank you for seeing me today!   GENERAL HEADACHE INFORMATION:   Natural supplements: Magnesium Oxide or Magnesium Glycinate 500 mg at bed (up to 800 mg daily) Coenzyme Q10 300 mg in AM Vitamin B2- 200 mg twice a day   Add 1 supplement at a time since even natural supplements can have undesirable side effects. You can sometimes buy supplements cheaper (especially Coenzyme Q10) at www.WebmailGuide.co.za or at [facility].  Migraine with aura: There is increased risk for stroke in women with migraine with aura and a contraindication for the combined contraceptive pill for use by women who have migraine with aura. The risk for women with migraine without aura is lower. However other risk factors like smoking are far more likely to increase stroke risk than migraine. There is a recommendation for no smoking and for the use of OCPs without estrogen such as progestogen only pills particularly for women with migraine with aura.Marland Kitchen People who have migraine headaches with auras may be 3 times more likely to have a stroke caused by a blood clot, compared to migraine patients who don't see auras. Women who take hormone-replacement therapy may be 30 percent more likely to suffer a clot-based stroke than women not taking medication containing estrogen. Other risk factors like smoking and high blood pressure may be  much more important.    Vitamins and herbs that show potential:    Magnesium: Magnesium (250 mg twice a day or 500 mg at bed) has a relaxant effect on smooth muscles such as blood vessels. Individuals suffering from frequent or daily headache usually have low magnesium levels which can be increase with daily supplementation of 400-750 mg. Three trials found 40-90% average headache reduction  when used as a preventative. Magnesium may help with headaches are aura, the best evidence for magnesium is for migraine with aura is its thought to stop the cortical spreading depression we believe is the pathophysiology of migraine aura.Magnesium also demonstrated the benefit in menstrually related migraine.  Magnesium is part of the messenger system in the serotonin cascade and it is a good muscle relaxant.  It is also useful for constipation which can be a side effect of other medications used to treat migraine. Good sources include nuts, whole grains, and tomatoes. Side Effects: loose stool/diarrhea  Riboflavin (vitamin B 2) 200 mg twice a day. This vitamin assists nerve cells in the production of ATP a principal energy storing molecule.  It is necessary for many chemical reactions in the body.  There have been at least 3 clinical trials of riboflavin using 400 mg per day all of which suggested that migraine frequency can be decreased.  All 3 trials showed significant improvement in over half of migraine sufferers.  The supplement is found in bread, cereal, milk, meat, and poultry.  Most Americans get more riboflavin than the recommended daily allowance, however riboflavin deficiency is not necessary for the supplements to help  prevent headache. Side effects: energizing, green urine   Coenzyme Q10: This is present in almost all cells in the body and is critical component for the conversion of energy.  Recent studies have shown that a nutritional supplement of CoQ10 can reduce the frequency of migraine attacks by improving the energy production of cells as with riboflavin.  Doses of  150 mg twice a day have been shown to be effective.   Melatonin: Increasing evidence shows correlation between melatonin secretion and headache conditions.  Melatonin supplementation has decreased headache intensity and duration.  It is widely used as a sleep aid.  Sleep is natures way of dealing with migraine.  A dose of 3 mg is recommended to start for headaches including cluster headache. Higher doses up to 15 mg has been reviewed for use in Cluster headache and have been used. The rationale behind using melatonin for cluster is that many theories regarding the cause of Cluster headache center around the disruption of the normal circadian rhythm in the brain.  This helps restore the normal circadian rhythm.   HEADACHE DIET: Foods and beverages which may trigger migraine Note that only 20% of headache patients are food sensitive. You will know if you are food sensitive if you get a headache consistently 20 minutes to 2 hours after eating a certain food. Only cut out a food if it causes headaches, otherwise you might remove foods you enjoy! What matters most for diet is to eat a well balanced healthy diet full of vegetables and low fat protein, and to not miss meals.   Chocolate, other sweets ALL cheeses except cottage and cream cheese Dairy products, yogurt, sour cream, ice cream Liver Meat extracts (Bovril, Marmite, meat tenderizers) Meats or fish which have undergone aging, fermenting, pickling or smoking. These include: Hotdogs,salami,Lox,sausage, mortadellas,smoked salmon, pepperoni, Pickled herring Pods of broad bean (English beans, Chinese pea pods, [place] (fava) beans, lima and navy beans Ripe avocado, ripe banana Yeast extracts or active yeast preparations such as Brewer's or Fleishman's (commercial bakes goods are permitted) Tomato based foods, pizza (lasagna, etc.)   MSG (monosodium glutamate) is disguised as many things; look for these common aliases: Monopotassium  glutamate Autolysed yeast Hydrolysed protein Sodium caseinate "flavorings" "all natural preservatives" Nutrasweet   Avoid all other foods that convincingly provoke headaches.   Resources: The Dizzy [place]air Laundry Your Headache Diet, migrainestrong.com  https://zamora-andrews.com/   Caffeine and Migraine For patients that have migraine, caffeine intake more than 3 days per week can lead to dependency and increased migraine frequency. I would recommend cutting back on your caffeine intake as best you can. The recommended amount of caffeine is 200-300 mg daily, although migraine patients may experience dependency at even lower doses. While you may notice an increase in headache temporarily, cutting back will be helpful for headaches in the long run. For more information on caffeine and migraine, visit: https://americanmigrainefoundation.org/resource-library/caffeine-and-migraine/   Headache Prevention Strategies:   1. Maintain a headache diary; learn to identify and avoid triggers.  - This can be a simple note where you log when you had a headache, associated symptoms, and medications used - There are several smartphone apps developed to help track migraines: Migraine Buddy, Migraine Monitor, Curelator N1-Headache App   Common triggers include: Emotional triggers: Emotional/Upset family or friends Emotional/Upset occupation Business reversal/success Anticipation anxiety Crisis-serious Post-crisis periodNew job/position   Physical triggers: Vacation Day Weekend Strenuous Exercise High Altitude Location New Move Menstrual Day Physical Illness Oversleep/Not enough sleep Weather changes Light: Photophobia or light  sesnitivity treatment involves a balance between desensitization and reduction in overly strong input. Use dark polarized glasses outside, but not inside. Avoid bright or fluorescent light, but do not dim environment to the point  that going into a normally lit room hurts. Consider FL-41 tint lenses, which reduce the most irritating wavelengths without blocking too much light.  These can be obtained at axonoptics.com or theraspecs.com Foods: see list above.   2. Limit use of acute treatments (over-the-counter medications, triptans, etc.) to no more than 2 days per week or 10 days per month to prevent medication overuse headache (rebound headache).     3. Follow a regular schedule (including weekends and holidays): Don't skip meals. Eat a balanced diet. 8 hours of sleep nightly. Minimize stress. Exercise 30 minutes per day. Being overweight is associated with a 5 times increased risk of chronic migraine. Keep well hydrated and drink 6-8 glasses of water per day.   4. Initiate non-pharmacologic measures at the earliest onset of your headache. Rest and quiet environment. Relax and reduce stress. Breathe2Relax is a free app that can instruct you on    some simple relaxtion and breathing techniques. Http://Dawnbuse.com is a    free website that provides teaching videos on relaxation.  Also, there are  many apps that   can be downloaded for "mindful" relaxation.  An app called YOGA NIDRA will help walk you through mindfulness. Another app called Calm can be downloaded to give you a structured mindfulness guide with daily reminders and skill development. Headspace for guided meditation Mindfulness Based Stress Reduction Online Course: www.palousemindfulness.com Cold compresses.   5. Don't wait!! Take the maximum allowable dosage of prescribed medication at the first sign of migraine.   6. Compliance:  Take prescribed medication regularly as directed and at the first sign of a migraine.   7. Communicate:  Call your physician when problems arise, especially if your headaches change, increase in frequency/severity, or become associated with neurological symptoms (weakness, numbness, slurred speech, etc.). Proceed to emergency room  if you experience new or worsening symptoms or symptoms do not resolve, if you have new neurologic symptoms or if headache is severe, or for any concerning symptom.   8. Headache/pain management therapies: Consider various complementary methods, including medication, behavioral therapy, psychological counselling, biofeedback, massage therapy, acupuncture, dry needling, and other modalities.  Such measures may reduce the need for medications. Counseling for pain management, where patients learn to function and ignore/minimize their pain, seems to work very well.   9. Recommend changing family's attention and focus away from patient's headaches. Instead, emphasize daily activities. If first question of day is 'How are your headaches/Do you have a headache today?', then patient will constantly think about headaches, thus making them worse. Goal is to re-direct attention away from headaches, toward daily activities and other distractions.   10. Helpful Websites: www.AmericanHeadacheSociety.org PatentHood.ch www.headaches.org TightMarket.nl www.achenet.org

## 2023-06-22 NOTE — Progress Notes (Unsigned)
 PATIENT: Diana Warren DOB: Apr 15, 1985  REASON FOR VISIT: follow up HISTORY FROM: patient  Virtual Visit via MyChart video  I connected with Diana Warren on 06/24/23 at  1:30 PM EDT via MyChart video and verified that I am speaking with the correct person using two identifiers.   I discussed the limitations, risks, security and privacy concerns of performing an evaluation and management service by Mychart video and the availability of in person appointments. I also discussed with the patient that there may be a patient responsible charge related to this service. The patient expressed understanding and agreed to proceed.   History of Present Illness:  06/22/23 ALL (Mychart):  [name] is a 38 y.o. female here today for follow up for migraines. She was last seen by Dr Delena Bali 06/2022 and doing well on Qulipta and diclofenac. Since, she reports discontinuing Qulipta approx 3-4 months ago. Headaches have remained well controlled. No headaches since discontinuing. She feels diclofenac works well for abortive therapy. She has not taken in months. She stays well hydrated. She is feeling well and without concerns.   HISTORY (copied from Dr Quentin Mulling previous note)  38 year old female with a history of kidney stones and depression who follows in clinic for chronic right sided headaches following COVID infection. Brain MRI 01/15/21 was normal.   At her last visit she was started diclofenac for rescue. Bennie Pierini was continued for prevention.   Interval History: Headaches remain well-controlled on Qulipta. States she has not had any migraines since her last visit. Will occasionally get lower level headaches which last for a few hours and typically resolve with ibuprofen. She has taken diclofenac a handful of times and it has worked well for rescue.     Headache days per month: 1 Headache free days per month: 29   Current Headache Regimen: Preventative: Qulipta 60 mg daily Abortive:  diclofenac 50-100 mg PRN     Prior Therapies                                  Preventive: Buspirone 5 mg TID Fluoxetine 40 mg daily Fioricet PRN Qulipta 60 mg    Rescue: Baclofen Toradol Decadron Zonisamide Relpax - lack of efficacy Diclofenac 50-100 mg PRN  Observations/Objective:  Generalized: Well developed, in no acute distress  Mentation: Alert oriented to time, place, history taking. Follows all commands speech and language fluent   Assessment and Plan:  38 y.o. year old female  has a past medical history of COVID (10/2020), Frequent headaches, and Medical history non-contributory. here with    ICD-10-CM   1. Migraine without aura and without status migrainosus, not intractable  G43.009      Diana Warren is doing well off Qulipta. We will continue diclofenac for abortive therapy. Healthy lifestyle habits encouraged. She will follow up with me in 1 year.   No orders of the defined types were placed in this encounter.   No orders of the defined types were placed in this encounter.    Follow Up Instructions:  I discussed the assessment and treatment plan with the patient. The patient was provided an opportunity to ask questions and all were answered. The patient agreed with the plan and demonstrated an understanding of the instructions.   The patient was advised to call back or seek an in-person evaluation if the symptoms worsen or if the condition fails to improve as anticipated.  I provided 15 minutes  of face-to-face and non face-to-face time during this MyChart video encounter. Patient located at their place of residence. Provider is in the office.    Shawnie Dapper, NP

## 2023-06-24 ENCOUNTER — Telehealth: Payer: BC Managed Care – PPO | Admitting: Psychiatry

## 2023-06-24 ENCOUNTER — Encounter: Payer: Self-pay | Admitting: Family Medicine

## 2023-06-24 ENCOUNTER — Telehealth (INDEPENDENT_AMBULATORY_CARE_PROVIDER_SITE_OTHER): Payer: BC Managed Care – PPO | Admitting: Family Medicine

## 2023-06-24 DIAGNOSIS — G43009 Migraine without aura, not intractable, without status migrainosus: Secondary | ICD-10-CM

## 2023-06-24 DIAGNOSIS — G43019 Migraine without aura, intractable, without status migrainosus: Secondary | ICD-10-CM

## 2023-10-27 ENCOUNTER — Other Ambulatory Visit: Payer: Self-pay | Admitting: Medical Genetics

## 2023-11-03 ENCOUNTER — Ambulatory Visit (HOSPITAL_BASED_OUTPATIENT_CLINIC_OR_DEPARTMENT_OTHER): Admitting: Student

## 2023-11-03 ENCOUNTER — Encounter (HOSPITAL_BASED_OUTPATIENT_CLINIC_OR_DEPARTMENT_OTHER): Payer: Self-pay | Admitting: Student

## 2023-11-03 VITALS — BP 118/67 | HR 72 | Temp 98.8°F | Resp 16 | Ht 66.73 in | Wt 172.6 lb

## 2023-11-03 DIAGNOSIS — Z131 Encounter for screening for diabetes mellitus: Secondary | ICD-10-CM | POA: Diagnosis not present

## 2023-11-03 DIAGNOSIS — Z7689 Persons encountering health services in other specified circumstances: Secondary | ICD-10-CM

## 2023-11-03 DIAGNOSIS — J302 Other seasonal allergic rhinitis: Secondary | ICD-10-CM | POA: Insufficient documentation

## 2023-11-03 DIAGNOSIS — Z0000 Encounter for general adult medical examination without abnormal findings: Secondary | ICD-10-CM | POA: Diagnosis not present

## 2023-11-03 DIAGNOSIS — Z136 Encounter for screening for cardiovascular disorders: Secondary | ICD-10-CM | POA: Diagnosis not present

## 2023-11-03 DIAGNOSIS — G43009 Migraine without aura, not intractable, without status migrainosus: Secondary | ICD-10-CM | POA: Diagnosis not present

## 2023-11-03 DIAGNOSIS — Z8679 Personal history of other diseases of the circulatory system: Secondary | ICD-10-CM | POA: Insufficient documentation

## 2023-11-03 DIAGNOSIS — F411 Generalized anxiety disorder: Secondary | ICD-10-CM | POA: Diagnosis not present

## 2023-11-03 DIAGNOSIS — Z13220 Encounter for screening for lipoid disorders: Secondary | ICD-10-CM

## 2023-11-03 NOTE — Patient Instructions (Signed)
 It was nice to see you today!  If you have any problems before your next visit feel free to message me via MyChart (minor issues or questions) or call the office, otherwise you may reach out to schedule an office visit.  Thank you! Pau Banh, PA-C

## 2023-11-03 NOTE — Progress Notes (Signed)
 New Patient Office Visit  Subjective    Patient ID: Diana Warren, female    DOB: 09-29-1985  Age: 38 y.o. MRN: [id number]  CC:  Chief Complaint  Patient presents with   Establish Care    Here to establish care.    Discussed the use of AI scribe software for clinical note transcription with the patient, who gave verbal consent to proceed.  History of Present Illness   Diana Warren is a 38 year old female with generalized anxiety disorder who presents to establish care and for Annual Physical Exam.  She is currently taking Buspar and Prozac for generalized anxiety disorder, with Prozac managed by her gynecologist. She reports that her anxiety is more prominent than depression and thinks she is doing okay on her current medications. Recent life events contribute to her anxiety. She experiences occasional night sweats, which she attributes to anxiety.  She has a history of seasonal allergies for which she uses Flonase.   No fibrocystic breast changes but she did have a mammogram in March  for a palpable abnormality that was further evaluated with an ultrasound and mammogram, both of which were normal.  She has a family history of heart disease, with her father having his first heart attack in his late 7s to early 90s. She is concerned about her cholesterol and heart health. She has a history of a heart murmur, suspected to be mitral valve prolapse, identified by her gynecologist. An echocardiogram was inconclusive, but no issues have been noted since.  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 She was treated with Qulipta  and baclofenac and continues to see neurology to remain active in her system.  No family history of colon cancer. No history of diabetes or high cholesterol. She does not smoke or vape.     Screenings:  Colon Cancer: fmh of colon Lung Cancer: no smoking or vaping Breast Cancer: recent mammogram normal- good until  41. Diabetes: indicated HLD: indicated   Outpatient Encounter Medications as of 11/03/2023  Medication Sig   busPIRone (BUSPAR) 5 MG tablet Take 5 mg by mouth daily as needed.    FLUoxetine (PROZAC) 40 MG capsule Take 40 mg by mouth daily.   fluticasone (FLONASE) 50 MCG/ACT nasal spray Place 2 sprays into the nose daily. (Patient taking differently: Place 2 sprays into the nose as needed.)   levonorgestrel (MIRENA, 52 MG,) 20 MCG/DAY IUD Mirena 20 mcg/24 hours (8 yrs) 52 mg intrauterine device  Take 1 device by intrauterine route.   [DISCONTINUED] cetirizine (ZYRTEC) 10 MG tablet Take 10 mg by mouth daily.   [DISCONTINUED] diclofenac  (CATAFLAM ) 50 MG tablet Take 1-2 pills at the onset of migraine   No facility-administered encounter medications on file as of 11/03/2023.    Past Medical History:  Diagnosis Date   Anxiety    COVID 10/2020   Depression    Frequent headaches     Past Surgical History:  Procedure Laterality Date   DILATION AND CURETTAGE OF UTERUS     DILATION AND EVACUATION  02/27/2011   Procedure: DILATATION AND EVACUATION;  Surgeon: Rosaline DELENA Luna;  Location: [facility];  Service: Gynecology;  Laterality: N/A;   LITHOTRIPSY     MOUTH SURGERY      Family History  Problem Relation Age of Onset   Anxiety disorder Mother    Hypertension Mother    Depression Mother    Hyperlipidemia Mother    Skin cancer Mother  unsure   Bladder Cancer Father    Hypertension Father    Diabetes Father    Heart disease Father    Hypothyroidism Father    Hyperlipidemia Father    COPD Father    Diabetes Maternal Grandmother    Heart disease Maternal Grandmother    Healthy Half-Brother     Social History   Socioeconomic History   Marital status: Married    Spouse name: Not on file   Number of children: 2   Years of education: Not on file   Highest education level: Master's degree (e.g., MA, MS, MEng, MEd, MSW, MBA)  Occupational History    Comment: NP  Tobacco  Use   Smoking status: Never    Passive exposure: Never   Smokeless tobacco: Never  Vaping Use   Vaping status: Never Used  Substance and Sexual Activity   Alcohol use: No   Drug use: No   Sexual activity: Yes    Birth control/protection: I.U.D.  Other Topics Concern   Not on file  Social History Narrative   Not on file   Social Drivers of Health   Financial Resource Strain: Not on file  Food Insecurity: No Food Insecurity (11/03/2023)   Hunger Vital Sign    Worried About Running Out of Food in the Last Year: Never true    Ran Out of Food in the Last Year: Never true  Transportation Needs: No Transportation Needs (11/03/2023)   PRAPARE - Administrator, Civil Service (Medical): No    Lack of Transportation (Non-Medical): No  Physical Activity: Not on file  Stress: Not on file  Social Connections: Not on file  Intimate Partner Violence: Not At Risk (11/03/2023)   Humiliation, Afraid, Rape, and Kick questionnaire    Fear of Current or Ex-Partner: No    Emotionally Abused: No    Physically Abused: No    Sexually Abused: No    ROS  Per HPI      Objective    BP 118/67   Pulse 72   Temp 98.8 F (37.1 C) (Oral)   Resp 16   Ht 5' 6.73 (1.695 m)   Wt 172 lb 9.6 oz (78.3 kg)   SpO2 97%   Breastfeeding No   BMI 27.25 kg/m   Physical Exam Constitutional:      General: She is not in acute distress.    Appearance: Normal appearance. She is not ill-appearing or diaphoretic.  HENT:     Head: Normocephalic and atraumatic.     Right Ear: Tympanic membrane, ear canal and external ear normal.     Left Ear: Tympanic membrane, ear canal and external ear normal.     Nose: Nose normal.     Mouth/Throat:     Mouth: Mucous membranes are moist.     Pharynx: Oropharynx is clear.  Eyes:     General: No scleral icterus.       Right eye: No discharge.        Left eye: No discharge.     Extraocular Movements: Extraocular movements intact.     Conjunctiva/sclera:  Conjunctivae normal.     Pupils: Pupils are equal, round, and reactive to light.  Neck:     Thyroid: No thyroid mass, thyromegaly or thyroid tenderness.     Vascular: No carotid bruit.  Cardiovascular:     Rate and Rhythm: Normal rate and regular rhythm.     Pulses: Normal pulses.     Heart sounds:  No friction rub. No gallop.     Comments: History of murmur- possible MVP Pulmonary:     Effort: Pulmonary effort is normal. No respiratory distress.     Breath sounds: Normal breath sounds. No wheezing, rhonchi or rales.  Chest:     Chest wall: No tenderness.  Abdominal:     General: Bowel sounds are normal. There is no distension.     Palpations: Abdomen is soft.     Tenderness: There is no abdominal tenderness. There is no guarding.  Musculoskeletal:        General: No swelling, deformity or signs of injury. Normal range of motion.     Cervical back: Neck supple.     Right lower leg: No edema.     Left lower leg: No edema.  Lymphadenopathy:     Cervical: No cervical adenopathy.     Right cervical: No superficial or posterior cervical adenopathy.    Left cervical: No superficial cervical adenopathy.  Skin:    General: Skin is warm and dry.     Coloration: Skin is not jaundiced or pale.     Findings: No rash.  Neurological:     General: No focal deficit present.     Mental Status: She is alert.     Motor: No weakness.  Psychiatric:        Mood and Affect: Mood normal.        Behavior: Behavior normal.    Assessment & Plan:   Assessment and Plan    Establishment of Care  Annual Exam Up to date on screenings and mammogram. No family history of colon cancer. No history of smoking, vaping, diabetes, or hyperlipidemia. Interested in monitoring cholesterol due to family history of heart disease. - Perform physical examination - Order blood work including lipid panel, CBC, CMP, and A1c - Count today's visit as her annual physical - Monitor cholesterol levels due to family  history of heart disease  Generalized Anxiety Disorder (GAD) Chronic, stable. Anxiety-driven symptoms managed with Buspar and Prozac. Night sweats likely related to anxiety. Potential thyroid involvement in anxiety symptoms discussed. - Continue Buspar and Prozac as prescribed - Order thyroid function tests to rule out thyroid involvement in anxiety symptoms  Migraine, post-COVID Post-COVID migraines previously treated with Quilipta and diclofenac . Continues neurology follow-up. - Continue follow-up with neurology as needed  History of Murmur Suspected mitral valve prolapse diagnosed by cardiologist. Previous echocardiogram inconclusive.  Seasonal Allergies Chronic, stable. Seasonal allergies managed with Flonase. Reports stability with current treatment. - Continue Flonase    Return in about 1 year (around 11/02/2024) for Annual Physical.   Porshia Blizzard T Tresea Heine, PA-C

## 2023-11-04 ENCOUNTER — Ambulatory Visit (HOSPITAL_BASED_OUTPATIENT_CLINIC_OR_DEPARTMENT_OTHER): Payer: Self-pay | Admitting: Student

## 2023-11-04 LAB — TSH: TSH: 1.95 u[IU]/mL (ref 0.450–4.500)

## 2023-11-04 LAB — CBC WITH DIFFERENTIAL/PLATELET
Basophils Absolute: 0.1 x10E3/uL (ref 0.0–0.2)
Basos: 1 %
EOS (ABSOLUTE): 0.1 x10E3/uL (ref 0.0–0.4)
Eos: 1 %
Hematocrit: 38.9 % (ref 34.0–46.6)
Hemoglobin: 12.6 g/dL (ref 11.1–15.9)
Immature Grans (Abs): 0 x10E3/uL (ref 0.0–0.1)
Immature Granulocytes: 0 %
Lymphocytes Absolute: 2.7 x10E3/uL (ref 0.7–3.1)
Lymphs: 29 %
MCH: 30.2 pg (ref 26.6–33.0)
MCHC: 32.4 g/dL (ref 31.5–35.7)
MCV: 93 fL (ref 79–97)
Monocytes Absolute: 0.6 x10E3/uL (ref 0.1–0.9)
Monocytes: 6 %
Neutrophils Absolute: 5.9 x10E3/uL (ref 1.4–7.0)
Neutrophils: 63 %
Platelets: 250 x10E3/uL (ref 150–450)
RBC: 4.17 x10E6/uL (ref 3.77–5.28)
RDW: 12.2 % (ref 11.7–15.4)
WBC: 9.3 x10E3/uL (ref 3.4–10.8)

## 2023-11-04 LAB — COMPREHENSIVE METABOLIC PANEL WITH GFR
ALT: 22 IU/L (ref 0–32)
AST: 17 IU/L (ref 0–40)
Albumin: 4.5 g/dL (ref 3.9–4.9)
Alkaline Phosphatase: 103 IU/L (ref 44–121)
BUN/Creatinine Ratio: 16 (ref 9–23)
BUN: 10 mg/dL (ref 6–20)
Bilirubin Total: 0.4 mg/dL (ref 0.0–1.2)
CO2: 23 mmol/L (ref 20–29)
Calcium: 9.4 mg/dL (ref 8.7–10.2)
Chloride: 100 mmol/L (ref 96–106)
Creatinine, Ser: 0.62 mg/dL (ref 0.57–1.00)
Globulin, Total: 2.9 g/dL (ref 1.5–4.5)
Glucose: 80 mg/dL (ref 70–99)
Potassium: 3.8 mmol/L (ref 3.5–5.2)
Sodium: 137 mmol/L (ref 134–144)
Total Protein: 7.4 g/dL (ref 6.0–8.5)
eGFR: 118 mL/min/1.73 (ref >59)

## 2023-11-04 LAB — LIPID PANEL
Chol/HDL Ratio: 3.5 ratio (ref 0.0–4.4)
Cholesterol, Total: 170 mg/dL (ref 100–199)
HDL: 49 mg/dL (ref >39)
LDL Chol Calc (NIH): 107 mg/dL — ABNORMAL HIGH (ref 0–99)
Triglycerides: 73 mg/dL (ref 0–149)
VLDL Cholesterol Cal: 14 mg/dL (ref 5–40)

## 2023-11-04 LAB — HEMOGLOBIN A1C
Est. average glucose Bld gHb Est-mCnc: 103 mg/dL
Hgb A1c MFr Bld: 5.2 % (ref 4.8–5.6)

## 2023-12-01 ENCOUNTER — Other Ambulatory Visit

## 2023-12-01 DIAGNOSIS — Z006 Encounter for examination for normal comparison and control in clinical research program: Secondary | ICD-10-CM

## 2023-12-11 LAB — GENECONNECT MOLECULAR SCREEN: Genetic Analysis Overall Interpretation: NEGATIVE

## 2023-12-14 ENCOUNTER — Other Ambulatory Visit (HOSPITAL_BASED_OUTPATIENT_CLINIC_OR_DEPARTMENT_OTHER): Payer: Self-pay | Admitting: Student

## 2023-12-14 ENCOUNTER — Encounter (HOSPITAL_BASED_OUTPATIENT_CLINIC_OR_DEPARTMENT_OTHER): Payer: Self-pay | Admitting: Student

## 2023-12-14 DIAGNOSIS — F411 Generalized anxiety disorder: Secondary | ICD-10-CM

## 2023-12-14 MED ORDER — BUSPIRONE HCL 5 MG PO TABS: 5.0000 mg | ORAL_TABLET | Freq: Three times a day (TID) | ORAL | 3 refills | Status: AC | PRN

## 2024-11-08 ENCOUNTER — Encounter (HOSPITAL_BASED_OUTPATIENT_CLINIC_OR_DEPARTMENT_OTHER): Admitting: Student
